# Patient Record
Sex: Female | Born: 1935 | Race: Black or African American | Hispanic: No | Smoking: Never smoker
Health system: Southern US, Community
[De-identification: ages and names within clinical notes are randomized; demographics above are authoritative.]

## PROBLEM LIST (undated history)

## (undated) DIAGNOSIS — N3946 Mixed incontinence: Secondary | ICD-10-CM

## (undated) DIAGNOSIS — F0390 Unspecified dementia without behavioral disturbance: Secondary | ICD-10-CM

## (undated) DIAGNOSIS — G309 Alzheimer's disease, unspecified: Secondary | ICD-10-CM

## (undated) DIAGNOSIS — F329 Major depressive disorder, single episode, unspecified: Secondary | ICD-10-CM

## (undated) DIAGNOSIS — F32A Depression, unspecified: Secondary | ICD-10-CM

## (undated) DIAGNOSIS — I10 Essential (primary) hypertension: Secondary | ICD-10-CM

## (undated) DIAGNOSIS — F0280 Dementia in other diseases classified elsewhere without behavioral disturbance: Secondary | ICD-10-CM

## (undated) DIAGNOSIS — D649 Anemia, unspecified: Secondary | ICD-10-CM

## (undated) DIAGNOSIS — M1990 Unspecified osteoarthritis, unspecified site: Secondary | ICD-10-CM

## (undated) DIAGNOSIS — E079 Disorder of thyroid, unspecified: Secondary | ICD-10-CM

## (undated) HISTORY — PX: NO PAST SURGERIES: SHX2092

---

## 2006-10-09 ENCOUNTER — Inpatient Hospital Stay: Payer: Self-pay | Admitting: Internal Medicine

## 2006-10-09 ENCOUNTER — Other Ambulatory Visit: Payer: Self-pay

## 2006-10-10 ENCOUNTER — Other Ambulatory Visit: Payer: Self-pay

## 2006-10-31 ENCOUNTER — Ambulatory Visit: Payer: Self-pay | Admitting: Internal Medicine

## 2008-04-03 IMAGING — CR DG CHEST 1V PORT
1 series · 1 of 1 positions shown · non-contrast
Comparison: none

REASON FOR EXAM: Low O2
COMMENTS:

[view not recorded]
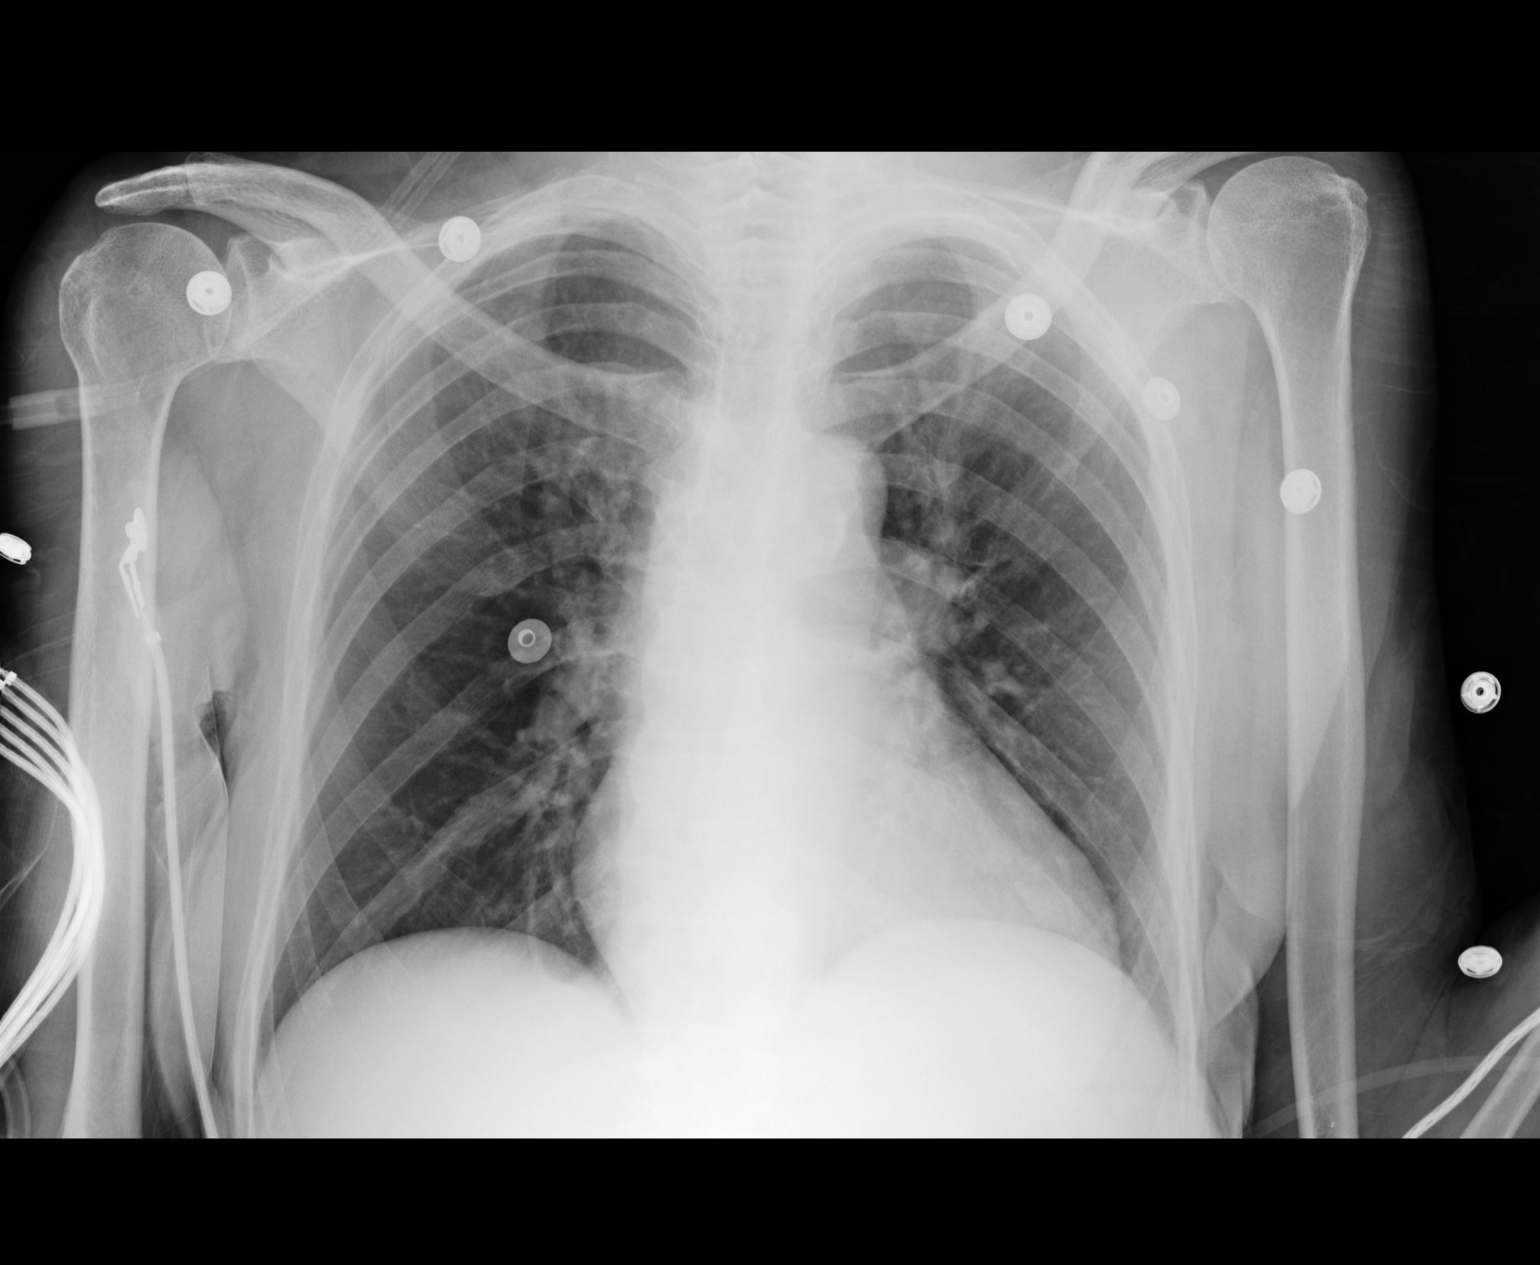

[1 of 1 positions shown; findings below may reference images not displayed]

PROCEDURE:     DXR - DXR PORTABLE CHEST SINGLE VIEW  - October 09, 2006 [DATE]

RESULT:     AP portable exam made on October 09, 2006 at [DATE] p.m. is
compared with the same day obtained at [DATE] p.m.  The cardiomediastinal
structures are within normal limits.  The lung fields are clear with no
effusions.
IMPRESSION: Lung fields are clear.

## 2013-06-13 ENCOUNTER — Ambulatory Visit: Payer: Self-pay | Admitting: Hematology and Oncology

## 2013-06-20 ENCOUNTER — Ambulatory Visit: Payer: Self-pay | Admitting: Surgery

## 2013-06-20 LAB — CBC CANCER CENTER
Basophil #: 0.1 x10 3/mm (ref 0.0–0.1)
Basophil %: 0.9 %
Eosinophil #: 0.2 x10 3/mm (ref 0.0–0.7)
HGB: 12.5 g/dL (ref 12.0–16.0)
Lymphocyte #: 2.6 x10 3/mm (ref 1.0–3.6)
Lymphocyte %: 37.1 %
MCHC: 35 g/dL (ref 32.0–36.0)
Monocyte %: 7.7 %
Neutrophil #: 3.7 x10 3/mm (ref 1.4–6.5)
Neutrophil %: 51.8 %
RBC: 3.99 10*6/uL (ref 3.80–5.20)
RDW: 13.2 % (ref 11.5–14.5)
WBC: 7.1 x10 3/mm (ref 3.6–11.0)

## 2013-06-20 LAB — COMPREHENSIVE METABOLIC PANEL
Anion Gap: 10 (ref 7–16)
BUN: 14 mg/dL (ref 7–18)
Bilirubin,Total: 0.7 mg/dL (ref 0.2–1.0)
Calcium, Total: 9.8 mg/dL (ref 8.5–10.1)
Chloride: 99 mmol/L (ref 98–107)
Creatinine: 1.08 mg/dL (ref 0.60–1.30)
EGFR (African American): 59 — ABNORMAL LOW
Osmolality: 272 (ref 275–301)
SGOT(AST): 20 U/L (ref 15–37)
Sodium: 135 mmol/L — ABNORMAL LOW (ref 136–145)

## 2013-07-14 ENCOUNTER — Ambulatory Visit: Payer: Self-pay | Admitting: Surgery

## 2014-04-09 ENCOUNTER — Ambulatory Visit: Payer: Self-pay | Admitting: Hematology and Oncology

## 2014-04-28 ENCOUNTER — Ambulatory Visit: Payer: Self-pay | Admitting: Hematology and Oncology

## 2014-10-07 ENCOUNTER — Inpatient Hospital Stay: Payer: Self-pay | Admitting: Internal Medicine

## 2014-10-07 LAB — COMPREHENSIVE METABOLIC PANEL
ALBUMIN: 3.4 g/dL (ref 3.4–5.0)
ALT: 36 U/L
AST: 24 U/L (ref 15–37)
Alkaline Phosphatase: 78 U/L
Anion Gap: 8 (ref 7–16)
BILIRUBIN TOTAL: 0.3 mg/dL (ref 0.2–1.0)
BUN: 12 mg/dL (ref 7–18)
CALCIUM: 8.8 mg/dL (ref 8.5–10.1)
CREATININE: 1.22 mg/dL (ref 0.60–1.30)
Chloride: 106 mmol/L (ref 98–107)
Co2: 27 mmol/L (ref 21–32)
EGFR (African American): 55 — ABNORMAL LOW
EGFR (Non-African Amer.): 46 — ABNORMAL LOW
Glucose: 164 mg/dL — ABNORMAL HIGH (ref 65–99)
Osmolality: 285 (ref 275–301)
POTASSIUM: 4.5 mmol/L (ref 3.5–5.1)
Sodium: 141 mmol/L (ref 136–145)
TOTAL PROTEIN: 8 g/dL (ref 6.4–8.2)

## 2014-10-07 LAB — URINALYSIS, COMPLETE
BILIRUBIN, UR: NEGATIVE
Bacteria: NONE SEEN
Blood: NEGATIVE
Glucose,UR: NEGATIVE mg/dL (ref 0–75)
KETONE: NEGATIVE
Nitrite: NEGATIVE
PH: 6 (ref 4.5–8.0)
PROTEIN: NEGATIVE
RBC,UR: 1 /HPF (ref 0–5)
SPECIFIC GRAVITY: 1.02 (ref 1.003–1.030)
WBC UR: 2 /HPF (ref 0–5)

## 2014-10-07 LAB — PROTIME-INR
INR: 1.1
Prothrombin Time: 14.3 secs (ref 11.5–14.7)

## 2014-10-07 LAB — CBC WITH DIFFERENTIAL/PLATELET
Basophil #: 0 10*3/uL (ref 0.0–0.1)
Basophil %: 0.3 %
EOS ABS: 0.1 10*3/uL (ref 0.0–0.7)
EOS PCT: 0.7 %
HCT: 34.6 % — AB (ref 35.0–47.0)
HGB: 11.7 g/dL — ABNORMAL LOW (ref 12.0–16.0)
LYMPHS PCT: 5.7 %
Lymphocyte #: 0.6 10*3/uL — ABNORMAL LOW (ref 1.0–3.6)
MCH: 31.6 pg (ref 26.0–34.0)
MCHC: 33.9 g/dL (ref 32.0–36.0)
MCV: 93 fL (ref 80–100)
Monocyte #: 0.5 x10 3/mm (ref 0.2–0.9)
Monocyte %: 5.1 %
NEUTROS ABS: 8.8 10*3/uL — AB (ref 1.4–6.5)
NEUTROS PCT: 88.2 %
Platelet: 170 10*3/uL (ref 150–440)
RBC: 3.71 10*6/uL — AB (ref 3.80–5.20)
RDW: 13 % (ref 11.5–14.5)
WBC: 10 10*3/uL (ref 3.6–11.0)

## 2014-10-07 LAB — PHOSPHORUS: PHOSPHORUS: 1.2 mg/dL — AB (ref 2.5–4.9)

## 2014-10-07 LAB — ED INFLUENZA
H1N1FLUPCR: NOT DETECTED
INFLBPCR: NEGATIVE
Influenza A By PCR: NEGATIVE

## 2014-10-07 LAB — MAGNESIUM: MAGNESIUM: 1.3 mg/dL — AB

## 2014-10-07 LAB — TROPONIN I: Troponin-I: <0.02 ng/mL

## 2014-10-08 LAB — BASIC METABOLIC PANEL
Anion Gap: 7 (ref 7–16)
BUN: 10 mg/dL (ref 7–18)
CO2: 25 mmol/L (ref 21–32)
CREATININE: 1.16 mg/dL (ref 0.60–1.30)
Calcium, Total: 7.8 mg/dL — ABNORMAL LOW (ref 8.5–10.1)
Chloride: 108 mmol/L — ABNORMAL HIGH (ref 98–107)
EGFR (African American): 59 — ABNORMAL LOW
EGFR (Non-African Amer.): 48 — ABNORMAL LOW
Glucose: 91 mg/dL (ref 65–99)
Osmolality: 278 (ref 275–301)
Potassium: 4 mmol/L (ref 3.5–5.1)
Sodium: 140 mmol/L (ref 136–145)

## 2014-10-08 LAB — CBC WITH DIFFERENTIAL/PLATELET
Basophil #: 0 10*3/uL (ref 0.0–0.1)
Basophil %: 0.1 %
Eosinophil #: 0.1 10*3/uL (ref 0.0–0.7)
Eosinophil %: 1.7 %
HCT: 30.2 % — ABNORMAL LOW (ref 35.0–47.0)
HGB: 10.1 g/dL — ABNORMAL LOW (ref 12.0–16.0)
LYMPHS ABS: 0.8 10*3/uL — AB (ref 1.0–3.6)
Lymphocyte %: 10 %
MCH: 31.1 pg (ref 26.0–34.0)
MCHC: 33.6 g/dL (ref 32.0–36.0)
MCV: 93 fL (ref 80–100)
MONOS PCT: 7.9 %
Monocyte #: 0.7 x10 3/mm (ref 0.2–0.9)
NEUTROS PCT: 80.3 %
Neutrophil #: 6.7 10*3/uL — ABNORMAL HIGH (ref 1.4–6.5)
Platelet: 138 10*3/uL — ABNORMAL LOW (ref 150–440)
RBC: 3.25 10*6/uL — ABNORMAL LOW (ref 3.80–5.20)
RDW: 12.8 % (ref 11.5–14.5)
WBC: 8.4 10*3/uL (ref 3.6–11.0)

## 2014-10-08 LAB — PHOSPHORUS: Phosphorus: 1.9 mg/dL — ABNORMAL LOW (ref 2.5–4.9)

## 2014-10-08 LAB — MAGNESIUM: Magnesium: 2.1 mg/dL

## 2014-10-09 LAB — CBC WITH DIFFERENTIAL/PLATELET
Basophil #: 0 10*3/uL (ref 0.0–0.1)
Basophil %: 0.4 %
EOS PCT: 5.8 %
Eosinophil #: 0.3 10*3/uL (ref 0.0–0.7)
HCT: 29.7 % — ABNORMAL LOW (ref 35.0–47.0)
HGB: 10 g/dL — ABNORMAL LOW (ref 12.0–16.0)
Lymphocyte #: 1.1 10*3/uL (ref 1.0–3.6)
Lymphocyte %: 20.3 %
MCH: 31.4 pg (ref 26.0–34.0)
MCHC: 33.6 g/dL (ref 32.0–36.0)
MCV: 93 fL (ref 80–100)
MONO ABS: 0.5 x10 3/mm (ref 0.2–0.9)
MONOS PCT: 10.2 %
NEUTROS ABS: 3.3 10*3/uL (ref 1.4–6.5)
Neutrophil %: 63.3 %
Platelet: 126 10*3/uL — ABNORMAL LOW (ref 150–440)
RBC: 3.18 10*6/uL — ABNORMAL LOW (ref 3.80–5.20)
RDW: 13.2 % (ref 11.5–14.5)
WBC: 5.2 10*3/uL (ref 3.6–11.0)

## 2014-10-09 LAB — BASIC METABOLIC PANEL
Anion Gap: 7 (ref 7–16)
BUN: 7 mg/dL (ref 7–18)
CHLORIDE: 107 mmol/L (ref 98–107)
CO2: 24 mmol/L (ref 21–32)
CREATININE: 1.05 mg/dL (ref 0.60–1.30)
Calcium, Total: 8.3 mg/dL — ABNORMAL LOW (ref 8.5–10.1)
GFR CALC NON AF AMER: 54 — AB
Glucose: 101 mg/dL — ABNORMAL HIGH (ref 65–99)
Osmolality: 274 (ref 275–301)
Potassium: 3.8 mmol/L (ref 3.5–5.1)
SODIUM: 138 mmol/L (ref 136–145)

## 2014-10-09 LAB — URINE CULTURE

## 2014-10-09 LAB — PHOSPHORUS: PHOSPHORUS: 2.1 mg/dL — AB (ref 2.5–4.9)

## 2014-10-10 LAB — BETA STREP CULTURE(ARMC)

## 2014-10-12 LAB — CULTURE, BLOOD (SINGLE)

## 2014-10-16 ENCOUNTER — Emergency Department: Payer: Self-pay | Admitting: Emergency Medicine

## 2014-10-16 LAB — URINALYSIS, COMPLETE
BACTERIA: NONE SEEN
Bilirubin,UR: NEGATIVE
GLUCOSE, UR: NEGATIVE mg/dL (ref 0–75)
Ketone: NEGATIVE
NITRITE: NEGATIVE
PH: 7 (ref 4.5–8.0)
Protein: 100
RBC,UR: 155 /HPF (ref 0–5)
Specific Gravity: 1.019 (ref 1.003–1.030)
Squamous Epithelial: 17

## 2014-10-16 LAB — CBC
HCT: 36.3 % (ref 35.0–47.0)
HGB: 12 g/dL (ref 12.0–16.0)
MCH: 30.6 pg (ref 26.0–34.0)
MCHC: 33.1 g/dL (ref 32.0–36.0)
MCV: 93 fL (ref 80–100)
Platelet: 114 10*3/uL — ABNORMAL LOW (ref 150–440)
RBC: 3.92 10*6/uL (ref 3.80–5.20)
RDW: 13.2 % (ref 11.5–14.5)
WBC: 4.1 10*3/uL (ref 3.6–11.0)

## 2014-10-16 LAB — COMPREHENSIVE METABOLIC PANEL
ALBUMIN: 3.4 g/dL (ref 3.4–5.0)
Alkaline Phosphatase: 58 U/L
Anion Gap: 12 (ref 7–16)
BILIRUBIN TOTAL: 0.4 mg/dL (ref 0.2–1.0)
BUN: 9 mg/dL (ref 7–18)
CHLORIDE: 98 mmol/L (ref 98–107)
CO2: 23 mmol/L (ref 21–32)
CREATININE: 1.27 mg/dL (ref 0.60–1.30)
Calcium, Total: 8.8 mg/dL (ref 8.5–10.1)
EGFR (Non-African Amer.): 44 — ABNORMAL LOW
GFR CALC AF AMER: 53 — AB
Glucose: 119 mg/dL — ABNORMAL HIGH (ref 65–99)
Osmolality: 266 (ref 275–301)
POTASSIUM: 4.2 mmol/L (ref 3.5–5.1)
SGOT(AST): 43 U/L — ABNORMAL HIGH (ref 15–37)
SGPT (ALT): 51 U/L
SODIUM: 133 mmol/L — AB (ref 136–145)
Total Protein: 8.2 g/dL (ref 6.4–8.2)

## 2014-10-17 LAB — URINE CULTURE

## 2015-02-28 ENCOUNTER — Emergency Department
Admit: 2015-02-28 | Disposition: A | Discharge status: Skilled Nursing Facility | Payer: Self-pay | Admitting: Emergency Medicine

## 2015-02-28 LAB — CBC
HCT: 36.4 % (ref 35.0–47.0)
HGB: 12.2 g/dL (ref 12.0–16.0)
MCH: 30.3 pg (ref 26.0–34.0)
MCHC: 33.5 g/dL (ref 32.0–36.0)
MCV: 91 fL (ref 80–100)
PLATELETS: 160 10*3/uL (ref 150–440)
RBC: 4.01 10*6/uL (ref 3.80–5.20)
RDW: 13.2 % (ref 11.5–14.5)
WBC: 10.9 10*3/uL (ref 3.6–11.0)

## 2015-02-28 LAB — COMPREHENSIVE METABOLIC PANEL
ALK PHOS: 65 U/L
Albumin: 4.2 g/dL
Anion Gap: 9 (ref 7–16)
BUN: 15 mg/dL
Bilirubin,Total: 0.6 mg/dL
CALCIUM: 10 mg/dL
Chloride: 103 mmol/L
Co2: 26 mmol/L
Creatinine: 0.9 mg/dL
EGFR (African American): >60
EGFR (Non-African Amer.): >60
Glucose: 145 mg/dL — ABNORMAL HIGH
POTASSIUM: 4 mmol/L
SGOT(AST): 26 U/L
SGPT (ALT): 24 U/L
Sodium: 138 mmol/L
TOTAL PROTEIN: 8.4 g/dL — AB

## 2015-02-28 LAB — URINALYSIS, COMPLETE
BLOOD: NEGATIVE
Bilirubin,UR: NEGATIVE
Glucose,UR: NEGATIVE mg/dL (ref 0–75)
Ketone: NEGATIVE
NITRITE: POSITIVE
Ph: 7 (ref 4.5–8.0)
Protein: NEGATIVE
Specific Gravity: 1.017 (ref 1.003–1.030)

## 2015-03-02 LAB — URINE CULTURE

## 2015-03-06 NOTE — H&P (Signed)
PATIENT NAME:  Cassandra Steele, Cassandra Steele MR#:  [id number] DATE OF BIRTH:  Apr 03, 1936  PRIMARY CARE PHYSICIAN: Lavera Guise, MD  CHIEF COMPLAINT: Fever from facility.   HISTORY OF PRESENT ILLNESS: This is a 79 year old female with a history of dementia, who presents with the above complaint. Apparently yesterday the patient was complaining of a sore throat, had a fever. They started Bactrim. Today, she continues to have fevers, so she was brought to the ER for further evaluation. In the ER, temperature was 103. Apparently, at the facility at [facility], there is some sort of viral illness going around and possibly flu, but there is no confirmed flu for this patient. Her flu here is negative.    REVIEW OF SYSTEMS: Unable to obtain as the patient has advanced dementia.   PAST MEDICAL HISTORY:  As per medical chart:  1.  Dementia.  2.  Hypertension.  3.  Depressive disorder.  4.  Hypothyroidism.  5.  Hypertension.  6.  Anemia of chronic disease.   MEDICATIONS: 1.  Aspirin 81 mg daily.  2.  Calcium 1 tablet p.o. b.i.d.  3.  Citalopram 40 mg daily. 4.  Haldol 0.5 mg in the morning.  5.  Haldol 0.5 mg p.r.n. agitation.  6.  Hydrocortisone topical b.i.d. p.r.n.  7.  Hydroxyzine hydrochloride 25 mg b.i.d. p.r.n.  8.  Synthroid 75 mcg daily.  9.  Ativan 0.5 mg q. 8 hours p.r.n.  10.  Metoprolol 25, 1/2 tablet b.i.d.  11.  Pravastatin 10 mg at bedtime.  12.  Proctozone, apply to affected area daily.  13.  Bactrim 1 tablet p.o. b.i.d., which was started yesterday for 7 days.  14.  Tylenol 325 two tablets q. 6 hours p.r.n. pain.   ALLERGIES: No known drug allergies.   PAST SURGICAL HISTORY: None recorded as per medical chart.   SOCIAL HISTORY:  Unknown tobacco, alcohol, or IV drug use as the patient has dementia and not documented in the medical chart.  FAMILY HISTORY: At this time due to her dementia this is unknown.   PHYSICAL EXAMINATION: VITAL SIGNS: Temperature 103.3, pulse 95,  respirations 18, blood pressure 96/59, 98% on room air.  GENERAL: The patient is demented, does not appear to be any acute distress. HEENT: Head is atraumatic. Pupils are round. Sclerae anicteric. Mucous membranes are moist. There are no exudates noted. Oropharynx is not erythematous. Tympanic membranes are intact with good light reflex.  NECK: Supple. No JVD, carotid bruit, or enlarged thyroid. No lymphadenopathy.  CARDIOVASCULAR:  Regular rate and rhythm. No murmur, gallops or rubs. PMI is not displaced.  LUNGS: Clear to auscultation without crackles, rales, rhonchi, or wheezing. Normal to percussion.  ABDOMEN:  Bowel sounds are positive.  Nontender, nondistended. No hepatosplenomegaly. EXTREMITIES: No clubbing, cyanosis, or edema. NEUROLOGIC:  Cranial nerves II through XII are grossly intact.  Unable to do a thorough neurologic exam as the patient is not very cooperative.  SKIN:  Reveals no rashes, lesions.  LABORATORY DATA:  Urinalysis trace LCE with just 2 white blood cells, but no bacteria. White blood cells 10.0, hemoglobin 11.7, hematocrit 35, platelets are 170,000. Sodium 141, potassium 4.5, chloride 106, bicarbonate 27, BUN 12, creatinine 1.22, glucose is 164, calcium 8.8, bilirubin is 0.3, alkaline phosphatase 78, AST 36, AST is 24, total protein 8.0, albumin is 3.4, magnesium 1.3. Troponin less than 0.02. Phosphorus is 1.2. INR is 1.1.  Influenza A and B are negative.  Lactic acid is 3.9.   IMAGING:  Chest  x-ray shows no acute cardiopulmonary disease.   EKG: Sinus tachycardia. No ST elevation or depression.   ASSESSMENT AND PLAN:  A 79 year old female with dementia, hypothyroidism, hypertension, who presents with a fever from her facility.  1.  Fever of unknown origin. The patient's chest x-ray shows no infiltrate. Her urinalysis is negative. Influenza A and B are negative. She has no bedsores or any skin conditions that would cause fever, so for this reason the patient will be admitted  with fever and sepsis. Sepsis criteria is met as she does have sepsis and hypotension.  I have empirically placed her on Zosyn. Blood cultures have been ordered along with a urine culture. We will repeat a chest x-ray in the a.m. I have also ordered a streptococcal throat culture as the patient was complaining apparently of sore throat yesterday and was placed on Bactrim for this, although on examination her oropharynx has no evidence of infection. Will monitor the patient's fever and further evaluation after the next 24 hours. 2.  Sepsis of unknown etiology with fever and hypotension.  Will monitor patient closely. The patient is on empiric antibiotics.  Blood and urine cultures have been ordered which will need to be followed up.  I will go ahead and order some fluids as well for her hypotension. 3.  Dementia with mood disorder. We will continue Haldol, citalopram.  4.  History of essential hypertension. Due to patient's low blood pressure, we will hold her blood pressure medications at this time.  5.  Hypothyroidism.  Will continue Synthroid.  .  The patient is full code status.   TIME SPENT: Approximately 45 minutes.   ____________________________ Donell Beers. Benjie Karvonen, MD spm:LT D: 10/07/2014 19:08:44 ET T: 10/07/2014 19:43:09 ET JOB#: [id number]  cc: Tarquin Welcher P. Benjie Karvonen, MD, <Dictator> Lavera Guise, MD Donell Beers Shaquasha Gerstel MD ELECTRONICALLY SIGNED 10/07/2014 20:44

## 2015-03-06 NOTE — Discharge Summary (Signed)
PATIENT NAME:  Cassandra Steele, Belia MR#:  [id number] DATE OF BIRTH:  30-May-1936  DATE OF ADMISSION:  10/07/2014 DATE OF DISCHARGE:  10/09/2014  PRIMARY CARE PHYSICIAN: Beverely RisenFozia Khan, MD  DISCHARGE DIAGNOSES: 1.  Fever of unknown origin. 2.  Sepsis of unknown etiology with fever and hypotension. 3.  Dementia with mood disorder. 4.  Anemia of chronic disease.   CONDITION: Stable.   CODE STATUS: FULL code.  HOME MEDICATIONS: Please refer to the medication reconciliation list. The patient's Lopressor was discontinued due to hypotension.   DISCHARGE DIET: Low-sodium, low-fat, low-cholesterol diet.   DISCHARGE ACTIVITY: As tolerated.   FOLLOW-UP CARE: Follow up with PCP within 1 to 2 weeks.   REASON FOR ADMISSION: Fever from facility.  HOSPITAL COURSE: The patient is a 79 year old African American female with a history of dementia who was sent from facility due to fever and some sore throat. The patient had a fever of 103 and was sent to ED. The patient is demented. No complaints. The patient had chest x-rays negative, urinalysis negative. WBC is in normal range. Renal function is in normal range. The patient's blood culture so far is negative. The patient has been treated with Zosyn; however, the patient's blood culture is negative so far.   For hypotension, which is possibly due to hypertension medication, Lopressor was discontinued. The patient's blood pressure is stable. We will hold Lopressor after discharge.   Anemia of chronic disease, is stable.   The patient's vital signs stable.   Physical examination is unremarkable. So far labs are stable. The patient will be discharged back to nursing facility today.   I discussed the patient's discharge plan with the nurse, social worker. The patient needs follow-up with her PCP within 1 to 2 weeks.   TIME SPENT: About 36 minutes.   ____________________________ Shaune PollackQing Sharine Cadle, MD qc:sb D: 10/09/2014 08:01:11 ET T: 10/09/2014 08:15:27  ET JOB#: [id number]  cc: Shaune PollackQing Marceline Napierala, MD, <Dictator> Shaune PollackQING Delan Ksiazek MD ELECTRONICALLY SIGNED 10/09/2014 15:23

## 2015-04-18 ENCOUNTER — Inpatient Hospital Stay
Admission: EM | Admit: 2015-04-18 | Discharge: 2015-04-21 | DRG: 871 | Disposition: A | Discharge status: Home Health | Payer: Medicare Other | Attending: Internal Medicine | Admitting: Internal Medicine

## 2015-04-18 ENCOUNTER — Emergency Department: Payer: Medicare Other

## 2015-04-18 ENCOUNTER — Encounter: Payer: Self-pay | Admitting: Internal Medicine

## 2015-04-18 DIAGNOSIS — R1110 Vomiting, unspecified: Secondary | ICD-10-CM | POA: Diagnosis present

## 2015-04-18 DIAGNOSIS — R109 Unspecified abdominal pain: Secondary | ICD-10-CM

## 2015-04-18 DIAGNOSIS — N390 Urinary tract infection, site not specified: Secondary | ICD-10-CM | POA: Diagnosis present

## 2015-04-18 DIAGNOSIS — F329 Major depressive disorder, single episode, unspecified: Secondary | ICD-10-CM | POA: Diagnosis present

## 2015-04-18 DIAGNOSIS — F0280 Dementia in other diseases classified elsewhere without behavioral disturbance: Secondary | ICD-10-CM | POA: Diagnosis present

## 2015-04-18 DIAGNOSIS — D638 Anemia in other chronic diseases classified elsewhere: Secondary | ICD-10-CM | POA: Diagnosis present

## 2015-04-18 DIAGNOSIS — G9340 Encephalopathy, unspecified: Secondary | ICD-10-CM | POA: Diagnosis present

## 2015-04-18 DIAGNOSIS — B955 Unspecified streptococcus as the cause of diseases classified elsewhere: Secondary | ICD-10-CM | POA: Diagnosis present

## 2015-04-18 DIAGNOSIS — A419 Sepsis, unspecified organism: Secondary | ICD-10-CM | POA: Diagnosis present

## 2015-04-18 DIAGNOSIS — I10 Essential (primary) hypertension: Secondary | ICD-10-CM | POA: Diagnosis present

## 2015-04-18 DIAGNOSIS — R6520 Severe sepsis without septic shock: Secondary | ICD-10-CM | POA: Diagnosis present

## 2015-04-18 DIAGNOSIS — F05 Delirium due to known physiological condition: Secondary | ICD-10-CM | POA: Diagnosis present

## 2015-04-18 HISTORY — DX: Essential (primary) hypertension: I10

## 2015-04-18 HISTORY — DX: Unspecified dementia, unspecified severity, without behavioral disturbance, psychotic disturbance, mood disturbance, and anxiety: F03.90

## 2015-04-18 LAB — URINALYSIS COMPLETE WITH MICROSCOPIC (ARMC ONLY)
BILIRUBIN URINE: NEGATIVE
GLUCOSE, UA: NEGATIVE mg/dL
KETONES UR: NEGATIVE mg/dL
Nitrite: NEGATIVE
PH: 5 (ref 5.0–8.0)
PROTEIN: 30 mg/dL — AB
Specific Gravity, Urine: 1.014 (ref 1.005–1.030)

## 2015-04-18 LAB — LACTIC ACID, PLASMA
LACTIC ACID, VENOUS: 3.2 mmol/L — AB (ref 0.5–2.0)
Lactic Acid, Venous: 7.9 mmol/L (ref 0.5–2.0)

## 2015-04-18 LAB — LIPASE, BLOOD: LIPASE: 28 U/L (ref 22–51)

## 2015-04-18 LAB — COMPREHENSIVE METABOLIC PANEL
ALT: 29 U/L (ref 14–54)
AST: 42 U/L — ABNORMAL HIGH (ref 15–41)
Albumin: 4.1 g/dL (ref 3.5–5.0)
Alkaline Phosphatase: 71 U/L (ref 38–126)
Anion gap: 17 — ABNORMAL HIGH (ref 5–15)
BUN: 15 mg/dL (ref 6–20)
CALCIUM: 9.6 mg/dL (ref 8.9–10.3)
CHLORIDE: 99 mmol/L — AB (ref 101–111)
CO2: 24 mmol/L (ref 22–32)
Creatinine, Ser: 1.04 mg/dL — ABNORMAL HIGH (ref 0.44–1.00)
GFR calc Af Amer: 59 mL/min — ABNORMAL LOW (ref >60)
GFR, EST NON AFRICAN AMERICAN: 51 mL/min — AB (ref >60)
GLUCOSE: 120 mg/dL — AB (ref 65–99)
POTASSIUM: 3.3 mmol/L — AB (ref 3.5–5.1)
Sodium: 140 mmol/L (ref 135–145)
Total Bilirubin: 1 mg/dL (ref 0.3–1.2)
Total Protein: 8.3 g/dL — ABNORMAL HIGH (ref 6.5–8.1)

## 2015-04-18 LAB — URINE DRUG SCREEN, QUALITATIVE (ARMC ONLY)
AMPHETAMINES, UR SCREEN: NOT DETECTED
BARBITURATES, UR SCREEN: NOT DETECTED
BENZODIAZEPINE, UR SCRN: NOT DETECTED
CANNABINOID 50 NG, UR ~~LOC~~: NOT DETECTED
COCAINE METABOLITE, UR ~~LOC~~: NOT DETECTED
MDMA (Ecstasy)Ur Screen: NOT DETECTED
METHADONE SCREEN, URINE: NOT DETECTED
Opiate, Ur Screen: NOT DETECTED
PHENCYCLIDINE (PCP) UR S: NOT DETECTED
TRICYCLIC, UR SCREEN: NOT DETECTED

## 2015-04-18 LAB — CBC WITH DIFFERENTIAL/PLATELET
Basophils Absolute: 0 10*3/uL (ref 0–0.1)
Basophils Relative: 0 %
Eosinophils Absolute: 0 10*3/uL (ref 0–0.7)
Eosinophils Relative: 0 %
HCT: 36.3 % (ref 35.0–47.0)
Hemoglobin: 11.9 g/dL — ABNORMAL LOW (ref 12.0–16.0)
LYMPHS PCT: 10 %
Lymphs Abs: 1.3 10*3/uL (ref 1.0–3.6)
MCH: 30.3 pg (ref 26.0–34.0)
MCHC: 32.8 g/dL (ref 32.0–36.0)
MCV: 92.4 fL (ref 80.0–100.0)
MONO ABS: 0.1 10*3/uL — AB (ref 0.2–0.9)
Monocytes Relative: 1 %
Neutro Abs: 12.5 10*3/uL — ABNORMAL HIGH (ref 1.4–6.5)
Neutrophils Relative %: 89 %
Platelets: 194 10*3/uL (ref 150–440)
RBC: 3.93 MIL/uL (ref 3.80–5.20)
RDW: 13.5 % (ref 11.5–14.5)
WBC: 14 10*3/uL — AB (ref 3.6–11.0)

## 2015-04-18 LAB — ETHANOL

## 2015-04-18 LAB — TROPONIN I

## 2015-04-18 MED ORDER — ACETAMINOPHEN 325 MG PO TABS
650.0000 mg | ORAL_TABLET | Freq: Four times a day (QID) | ORAL | Status: DC | PRN
  Administered 2015-04-18: 650 mg via ORAL
  Filled 2015-04-18: qty 2

## 2015-04-18 MED ORDER — BISACODYL 10 MG RE SUPP: 10.0000 mg | Freq: Every day | RECTAL | Status: DC | PRN

## 2015-04-18 MED ORDER — DOCUSATE SODIUM 100 MG PO CAPS
100.0000 mg | ORAL_CAPSULE | Freq: Two times a day (BID) | ORAL | Status: DC
  Administered 2015-04-18 – 2015-04-21 (×6): 100 mg via ORAL
  Filled 2015-04-18 (×6): qty 1

## 2015-04-18 MED ORDER — HALOPERIDOL 0.5 MG PO TABS
0.5000 mg | ORAL_TABLET | Freq: Four times a day (QID) | ORAL | Status: DC | PRN
Start: 2015-04-18 — End: 2015-04-21
  Filled 2015-04-18: qty 1

## 2015-04-18 MED ORDER — POTASSIUM CHLORIDE IN NACL 20-0.9 MEQ/L-% IV SOLN
INTRAVENOUS | Status: DC
  Administered 2015-04-18 – 2015-04-20 (×5): via INTRAVENOUS
  Filled 2015-04-18 (×11): qty 1000

## 2015-04-18 MED ORDER — HYDROXYZINE HCL 25 MG PO TABS: 25.0000 mg | ORAL_TABLET | Freq: Four times a day (QID) | ORAL | Status: DC | PRN

## 2015-04-18 MED ORDER — SODIUM CHLORIDE 0.9 % IV SOLN
1000.0000 mL | Freq: Once | INTRAVENOUS | Status: AC
  Administered 2015-04-18: 1000 mL via INTRAVENOUS

## 2015-04-18 MED ORDER — CALCIUM CARBONATE-VITAMIN D 500-200 MG-UNIT PO TABS
1.0000 | ORAL_TABLET | Freq: Two times a day (BID) | ORAL | Status: DC
  Administered 2015-04-18 – 2015-04-21 (×6): 1 via ORAL
  Filled 2015-04-18 (×8): qty 1

## 2015-04-18 MED ORDER — CITALOPRAM HYDROBROMIDE 20 MG PO TABS
40.0000 mg | ORAL_TABLET | Freq: Every day | ORAL | Status: DC
  Administered 2015-04-18 – 2015-04-20 (×3): 40 mg via ORAL
  Filled 2015-04-18 (×3): qty 2

## 2015-04-18 MED ORDER — VANCOMYCIN HCL IN DEXTROSE 750-5 MG/150ML-% IV SOLN
750.0000 mg | INTRAVENOUS | Status: DC
  Administered 2015-04-19: 750 mg via INTRAVENOUS
  Filled 2015-04-18 (×2): qty 150

## 2015-04-18 MED ORDER — ADULT MULTIVITAMIN W/MINERALS CH
1.0000 | ORAL_TABLET | Freq: Every day | ORAL | Status: DC
  Administered 2015-04-19 – 2015-04-21 (×3): 1 via ORAL
  Filled 2015-04-18 (×3): qty 1

## 2015-04-18 MED ORDER — PRAVASTATIN SODIUM 10 MG PO TABS
10.0000 mg | ORAL_TABLET | Freq: Every day | ORAL | Status: DC
Start: 2015-04-18 — End: 2015-04-21
  Administered 2015-04-18 – 2015-04-20 (×3): 10 mg via ORAL
  Filled 2015-04-18 (×3): qty 1

## 2015-04-18 MED ORDER — DARIFENACIN HYDROBROMIDE ER 7.5 MG PO TB24
7.5000 mg | ORAL_TABLET | Freq: Every day | ORAL | Status: DC
  Administered 2015-04-18 – 2015-04-21 (×4): 7.5 mg via ORAL
  Filled 2015-04-18 (×5): qty 1

## 2015-04-18 MED ORDER — SODIUM CHLORIDE 0.9 % IV SOLN: 1000.0000 mL | Freq: Once | INTRAVENOUS | Status: DC

## 2015-04-18 MED ORDER — ASPIRIN 81 MG PO CHEW
81.0000 mg | CHEWABLE_TABLET | Freq: Every day | ORAL | Status: DC
  Administered 2015-04-19 – 2015-04-21 (×3): 81 mg via ORAL
  Filled 2015-04-18 (×3): qty 1

## 2015-04-18 MED ORDER — VANCOMYCIN HCL IN DEXTROSE 1-5 GM/200ML-% IV SOLN
1000.0000 mg | Freq: Once | INTRAVENOUS | Status: AC
  Administered 2015-04-18: 1000 mg via INTRAVENOUS

## 2015-04-18 MED ORDER — ONDANSETRON HCL 4 MG/2ML IJ SOLN: 4.0000 mg | Freq: Four times a day (QID) | INTRAMUSCULAR | Status: DC | PRN

## 2015-04-18 MED ORDER — LEVOTHYROXINE SODIUM 75 MCG PO TABS
75.0000 ug | ORAL_TABLET | Freq: Every day | ORAL | Status: DC
  Administered 2015-04-19 – 2015-04-21 (×3): 75 ug via ORAL
  Filled 2015-04-18 (×3): qty 1

## 2015-04-18 MED ORDER — MORPHINE SULFATE 2 MG/ML IJ SOLN: 2.0000 mg | INTRAMUSCULAR | Status: DC | PRN

## 2015-04-18 MED ORDER — LORAZEPAM 2 MG/ML IJ SOLN: 0.5000 mg | INTRAMUSCULAR | Status: DC | PRN

## 2015-04-18 MED ORDER — ONDANSETRON HCL 4 MG PO TABS: 4.0000 mg | ORAL_TABLET | Freq: Four times a day (QID) | ORAL | Status: DC | PRN

## 2015-04-18 MED ORDER — HEPARIN SODIUM (PORCINE) 5000 UNIT/ML IJ SOLN
5000.0000 [IU] | Freq: Three times a day (TID) | INTRAMUSCULAR | Status: DC
  Administered 2015-04-18 – 2015-04-19 (×2): 5000 [IU] via SUBCUTANEOUS
  Filled 2015-04-18 (×2): qty 1

## 2015-04-18 MED ORDER — PIPERACILLIN-TAZOBACTAM 3.375 G IVPB
3.3750 g | Freq: Once | INTRAVENOUS | Status: AC
  Administered 2015-04-18: 3.375 g via INTRAVENOUS

## 2015-04-18 MED ORDER — SODIUM CHLORIDE 0.9 % IJ SOLN
3.0000 mL | Freq: Two times a day (BID) | INTRAMUSCULAR | Status: DC
  Administered 2015-04-18 – 2015-04-21 (×5): 3 mL via INTRAVENOUS

## 2015-04-18 MED ORDER — PIPERACILLIN-TAZOBACTAM 3.375 G IVPB
3.3750 g | Freq: Three times a day (TID) | INTRAVENOUS | Status: DC
  Administered 2015-04-18 – 2015-04-19 (×2): 3.375 g via INTRAVENOUS
  Filled 2015-04-18 (×5): qty 50

## 2015-04-18 MED ORDER — ACETAMINOPHEN 650 MG RE SUPP: 650.0000 mg | Freq: Four times a day (QID) | RECTAL | Status: DC | PRN

## 2015-04-18 MED ORDER — PIPERACILLIN-TAZOBACTAM 3.375 G IVPB
INTRAVENOUS | Status: AC
  Administered 2015-04-18: 3.375 g via INTRAVENOUS
  Filled 2015-04-18: qty 50

## 2015-04-18 MED ORDER — VANCOMYCIN HCL IN DEXTROSE 1-5 GM/200ML-% IV SOLN
INTRAVENOUS | Status: AC
  Administered 2015-04-18: 1000 mg via INTRAVENOUS
  Filled 2015-04-18: qty 200

## 2015-04-18 NOTE — Progress Notes (Addendum)
ANTIBIOTIC CONSULT NOTE - INITIAL  Pharmacy Consult for Vancomycin  Indication: sepsis/UTI  No Known Allergies  Patient Measurements: Height: 5\' 5"  (165.1 cm) Weight: 170 lb (77.111 kg) IBW/kg (Calculated) : 57 Adjusted Body Weight: 65.04 kg  Vital Signs: Temp: 102.7 F (39.3 C) (06/05 1418) Temp Source: Rectal (06/05 1418) BP: 121/75 mmHg (06/05 1500) Pulse Rate: 119 (06/05 1430) Intake/Output from previous day:   Intake/Output from this shift:    Labs:  Recent Labs  04/18/15 1408  WBC 14.0*  HGB 11.9*  PLT 194  CREATININE 1.04*   Estimated Creatinine Clearance: 47.2 mL/min (by C-G formula based on Cr of 1.04). No results for input(s): VANCOTROUGH, VANCOPEAK, VANCORANDOM, GENTTROUGH, GENTPEAK, GENTRANDOM, TOBRATROUGH, TOBRAPEAK, TOBRARND, AMIKACINPEAK, AMIKACINTROU, AMIKACIN in the last 72 hours.   Microbiology: No results found for this or any previous visit (from the past 720 hour(s)).  Medical History: Past Medical History  Diagnosis Date  . Hypertension   . Dementia      Assessment: 79 yo female here with UTI/sepsis ordered vancomycin per pharmacy to dose. Also ordered Zosyn. Vancomycin 1000 mg IV x1 given in the ED at 1508.  UCx, Bcx x2 pending  Ke 0.039, half life 17.77, Vd 46 L  Goal of Therapy:  Vancomycin trough level 15-20 mcg/ml  Plan:  Vancomycin 750 mg IV q18h to start 6/6 (tomorrow AM) at 0500 for stacked dosing Vancomycin trough ordered for 6/8 at 1030 Will need to continue to monitor renal function and cultures    Crist FatHannah Ronnetta Currington, PharmD, BCPS Clinical Pharmacist 04/18/2015,3:41 PM

## 2015-04-18 NOTE — Progress Notes (Signed)
Patient admitted to unit. Alert but disoriented x4. Unable to complete full admission navigator at this time due to patient's confusion and no family/caregivers present. Full assessment to Epic. Will continue to monitor.  Cassandra Steele, Cassandra Steele

## 2015-04-18 NOTE — ED Notes (Signed)
Unable to obtain to EKG Dr Cyril LoosenKinner notified

## 2015-04-18 NOTE — H&P (Signed)
History and Physical    Cassandra Steele ZOX:[id number] DOB: 06/02/1936 DOA: 6/5/2Ferrel Logan016  Referring physician: Dr. Cyril LoosenKinner PCP: Beverely RisenFozia Khan Specialists: none  Chief Complaint: AMS  HPI: Cassandra Steele is a 79 y.o. female has a past medical history significant for Dementia and HTN sent to ER from family care home with lethargy, confusion, and vomiting. Febrile in ER with tachycardia. UTI noted. Pt confused and unable to give hx. No family of family care home staff present. Now admitted for sepsis from urinary source.  Review of Systems: unable to obtain due to patients delirium and dementia  Past Medical History  Diagnosis Date  . Hypertension   . Dementia    History reviewed. No pertinent past surgical history. Social History:  reports that she has never smoked. She does not have any smokeless tobacco history on file. She reports that she does not drink alcohol. Her drug history is not on file.  No Known Allergies  Family History  Problem Relation Age of Onset  . Family history unknown: Yes    Prior to Admission medications   Medication Sig Start Date End Date Taking? Authorizing Provider  citalopram (CELEXA) 40 MG tablet Take 40 mg by mouth at bedtime. 04/16/15  Yes Historical Provider, MD  diclofenac sodium (VOLTAREN) 1 % GEL Apply 1 application topically 2 (two) times daily as needed. 04/15/15  Yes Historical Provider, MD  haloperidol (HALDOL) 0.5 MG tablet Take 0.5 mg by mouth daily. 04/15/15  Yes Historical Provider, MD  hydrocortisone 2.5 % cream Apply 1 application topically 2 (two) times daily as needed. For rash. 04/15/15  Yes Historical Provider, MD  levothyroxine (SYNTHROID, LEVOTHROID) 75 MCG tablet Take 75 mcg by mouth daily. 04/02/15  Yes Historical Provider, MD  LORazepam (ATIVAN) 0.5 MG tablet Take 0.5 mg by mouth every 8 (eight) hours as needed. 03/15/15  Yes Historical Provider, MD  pravastatin (PRAVACHOL) 10 MG tablet Take 10 mg by mouth at bedtime. 04/15/15  Yes Historical  Provider, MD  VESICARE 5 MG tablet Take 5 mg by mouth every evening. 04/15/15  Yes Historical Provider, MD   Physical Exam: Filed Vitals:   04/18/15 1418 04/18/15 1430 04/18/15 1500  BP: 138/122 92/74 121/75  Pulse:  119   Temp: 102.7 F (39.3 C)    TempSrc: Rectal    Resp:  29 23  Height: 5\' 5"  (1.651 m)    Weight: 77.111 kg (170 lb)    SpO2: 94% 95%      General:  Confused, crying but in no acute distress  Eyes: PERRL, EOMI, no scleral icterus  ENT: moist oropharynx  Neck: supple, no lymphadenopathy  Cardiovascular: rapid rate with regular rhythm without MRG; 2+ peripheral pulses, no JVD, no peripheral edema  Respiratory: CTA biL, good air movement without wheezing, rhonchi or crackled  Abdomen: soft, tender to palpation, positive bowel sounds, guarding but no rebound  Skin: no rashes  Musculoskeletal: normal bulk and tone, no joint swelling  Psychiatric: confused, not answering questions  Neurologic: CN 2-12 grossly intact, MS 5/5 in all 4  Labs on Admission:  Basic Metabolic Panel:  Recent Labs Lab 04/18/15 1408  NA 140  K 3.3*  CL 99*  CO2 24  GLUCOSE 120*  BUN 15  CREATININE 1.04*  CALCIUM 9.6   Liver Function Tests:  Recent Labs Lab 04/18/15 1408  AST 42*  ALT 29  ALKPHOS 71  BILITOT 1.0  PROT 8.3*  ALBUMIN 4.1    Recent Labs Lab 04/18/15 1408  LIPASE  28   No results for input(s): AMMONIA in the last 168 hours. CBC:  Recent Labs Lab 04/18/15 1408  WBC 14.0*  NEUTROABS 12.5*  HGB 11.9*  HCT 36.3  MCV 92.4  PLT 194   Cardiac Enzymes:  Recent Labs Lab 04/18/15 1408  TROPONINI <0.03    BNP (last 3 results) No results for input(s): BNP in the last 8760 hours.  ProBNP (last 3 results) No results for input(s): PROBNP in the last 8760 hours.  CBG: No results for input(s): GLUCAP in the last 168 hours.  Radiological Exams on Admission: Dg Chest Portable 1 View  04/18/2015   CLINICAL DATA:  Vomiting and mental status  changes.  EXAM: PORTABLE CHEST - 1 VIEW  COMPARISON:  One-view chest 02/28/2015  FINDINGS: The heart size is normal. Lung volumes are low, exaggerating the interstitium. There is no edema or effusion to suggest failure. No focal airspace consolidation is present. Degenerative changes are again noted in the shoulders.  IMPRESSION: 1. Low lung volumes. 2. No acute cardiopulmonary disease.   Electronically Signed   By: Marin Roberts M.D.   On: 04/18/2015 15:04    EKG: Independently reviewed.  Assessment/Plan Principal Problem:   Sepsis Active Problems:   Acute UTI   Delirium superimposed on dementia   HTN, goal below 140/90   Will admit to floor with IV fluids(with K+) and IV ABX. Cultures sent. Consult PT and CSW. Repeat labs in AM.  Diet: soft Fluids: NS with K+ DVT Prophylaxis: SQ Heparin  Code Status: FULL  Family Communication: none  Disposition Plan: SNF  Time spent: 50 min

## 2015-04-18 NOTE — ED Provider Notes (Signed)
[facility] Emergency Department Provider Note  ____________________________________________  Time seen: On arrival  I have reviewed the triage vital signs and the nursing notes.   HISTORY  Chief Complaint Altered Mental Status   Limitations to history of altered mental status and chronic dementia (Level 5 caveat)   HPI Cassandra Steele is a 79 y.o. female who presents with altered mental status. At baseline patient has chronic dementia but apparently is acting for more confused and abnormal today. History is extremely limited because the patient is unable to provide me with any information. Patient is actively vomiting in the room.   PMH  Dementia Hypertension Anemia of chronic disease Depression  PSH  None   No current outpatient prescriptions on file.  Allergies Review of patient's allergies indicates no known allergies.  No family history on file.  Social History Does not smoke  does not drink  does not do drugs  Review of Systems Level 5 Caveat: Unable to obtain review of symptoms secondary to significant altered mental status.  ____________________________________________   PHYSICAL EXAM:  VITAL SIGNS:  BP 121/75 mmHg  Pulse 119  Temp(Src) 102.7 F (39.3 C) (Rectal)  Resp 23  Ht  (1.651 m)  Wt 170 lb (77.111 kg)  BMI 28.29 kg/m2  SpO2 95%  ED Triage Vitals  Enc Vitals Group     BP --      Pulse --      Resp --      Temp --      Temp src --      SpO2 --      Weight --      Height --      Head Cir --      Peak Flow --      Pain Score --      Pain Loc --      Pain Edu? --      Excl. in GC? --      Constitutional: Disoriented and confused, anxious and shaking Eyes: Conjunctivae are normal. PERRL. ENT   Head: Normocephalic and atraumatic.   Nose: No rhinnorhea.   Mouth/Throat: Mucous membranes are moist. Cardiovascular: Tachycardia. Normal and symmetric distal pulses are present in all  extremities. No murmurs, rubs, or gallops. Respiratory: Normal respiratory effort without tachypnea nor retractions. Breath sounds are clear and equal bilaterally.  Gastrointestinal: Soft and non-tender in all quadrants. No distention. There is no CVA tenderness. Genitourinary: deferred Musculoskeletal: Nontender with normal range of motion in all extremities. No lower extremity tenderness nor edema. Neurologic:  No gross focal neurologic deficits are appreciated. Skin:  Skin is warm, dry and intact. No rash noted. Psychiatric: Patient is anxious.  ____________________________________________    LABS (pertinent positives/negatives)  Labs Reviewed  COMPREHENSIVE METABOLIC PANEL - Abnormal; Notable for the following:    Potassium 3.3 (*)    Chloride 99 (*)    Glucose, Bld 120 (*)    Creatinine, Ser 1.04 (*)    Total Protein 8.3 (*)    AST 42 (*)    GFR calc non Af Amer 51 (*)    GFR calc Af Amer 59 (*)    Anion gap 17 (*)    All other components within normal limits  CBC WITH DIFFERENTIAL/PLATELET - Abnormal; Notable for the following:    WBC 14.0 (*)    Hemoglobin 11.9 (*)    Neutro Abs 12.5 (*)    Monocytes Absolute 0.1 (*)    All other components within  normal limits  URINALYSIS COMPLETEWITH MICROSCOPIC ([facility] ONLY) - Abnormal; Notable for the following:    Color, Urine AMBER (*)    APPearance TURBID (*)    Hgb urine dipstick 1+ (*)    Protein, ur 30 (*)    Leukocytes, UA 3+ (*)    Bacteria, UA RARE (*)    Squamous Epithelial / LPF 0-5 (*)    All other components within normal limits  LACTIC ACID, PLASMA - Abnormal; Notable for the following:    Lactic Acid, Venous 7.9 (*)    All other components within normal limits  URINE CULTURE  CULTURE, BLOOD (ROUTINE X 2)  CULTURE, BLOOD (ROUTINE X 2)  ETHANOL  TROPONIN I  LIPASE, BLOOD  URINE DRUG SCREEN, QUALITATIVE ([facility] ONLY)  LACTIC ACID, PLASMA    ____________________________________________   EKG  Unable to  obtain accurate EKG given patient shaking  ____________________________________________    RADIOLOGY  chest x-ray no acute distress  ____________________________________________   PROCEDURES  Procedure(s) performed: none  Critical Care performed: none  ____________________________________________   INITIAL IMPRESSION / ASSESSMENT AND PLAN / ED COURSE  Pertinent labs & imaging results that were available during my care of the patient were reviewed by me and considered in my medical decision making (see chart for details).  Patient with tachycardia and increased altered mental status presents with fever. Strongly suspect a urinary tract infection/possible sepsis.    ----------------------------------------- 3:13 PM on 04/18/2015 -----------------------------------------  Patient with lactic acid of 8.1, rectal temperature of 102.9, elevated white blood cell count and tachycardia, and altered mental status most consistent with sepsis. Vancomycin and Zosyn given empirically. 30 MLS per kilogram of normal saline. Patient admitted to the medicine service  ____________________________________________   FINAL CLINICAL IMPRESSION(S) / ED DIAGNOSES  Final diagnoses:  Severe sepsis     Jene Everyobert Colon Rueth, MD 04/18/15 [id number]

## 2015-04-18 NOTE — ED Notes (Signed)
Pt is from a family care home, pt has dementia, staff report pt started vomiting with a change in increased mental status, pt has a strong smell of urine, pt combative upon arrival

## 2015-04-18 NOTE — ED Notes (Signed)
Pt  combative ED tech at bedside

## 2015-04-19 LAB — CBC
HCT: 29.1 % — ABNORMAL LOW (ref 35.0–47.0)
Hemoglobin: 10.1 g/dL — ABNORMAL LOW (ref 12.0–16.0)
MCH: 31.1 pg (ref 26.0–34.0)
MCHC: 34.7 g/dL (ref 32.0–36.0)
MCV: 89.5 fL (ref 80.0–100.0)
Platelets: 141 10*3/uL — ABNORMAL LOW (ref 150–440)
RBC: 3.25 MIL/uL — AB (ref 3.80–5.20)
RDW: 13.4 % (ref 11.5–14.5)
WBC: 12.5 10*3/uL — AB (ref 3.6–11.0)

## 2015-04-19 LAB — COMPREHENSIVE METABOLIC PANEL
ALT: 23 U/L (ref 14–54)
AST: 34 U/L (ref 15–41)
Albumin: 3 g/dL — ABNORMAL LOW (ref 3.5–5.0)
Alkaline Phosphatase: 41 U/L (ref 38–126)
Anion gap: 5 (ref 5–15)
BUN: 16 mg/dL (ref 6–20)
CALCIUM: 8.6 mg/dL — AB (ref 8.9–10.3)
CO2: 26 mmol/L (ref 22–32)
CREATININE: 1.02 mg/dL — AB (ref 0.44–1.00)
Chloride: 108 mmol/L (ref 101–111)
GFR calc non Af Amer: 52 mL/min — ABNORMAL LOW (ref >60)
Glucose, Bld: 90 mg/dL (ref 65–99)
POTASSIUM: 3.9 mmol/L (ref 3.5–5.1)
Sodium: 139 mmol/L (ref 135–145)
Total Bilirubin: 1 mg/dL (ref 0.3–1.2)
Total Protein: 6.1 g/dL — ABNORMAL LOW (ref 6.5–8.1)

## 2015-04-19 LAB — GLUCOSE, CAPILLARY
GLUCOSE-CAPILLARY: 72 mg/dL (ref 65–99)
GLUCOSE-CAPILLARY: 148 mg/dL — AB (ref 65–99)

## 2015-04-19 LAB — URINE CULTURE: Culture: >=100000

## 2015-04-19 LAB — MRSA PCR SCREENING: MRSA by PCR: NEGATIVE

## 2015-04-19 MED ORDER — OXYCODONE HCL 5 MG PO TABS
5.0000 mg | ORAL_TABLET | Freq: Four times a day (QID) | ORAL | Status: DC | PRN
  Administered 2015-04-19: 16:00:00 5 mg via ORAL
  Filled 2015-04-19: qty 1

## 2015-04-19 MED ORDER — ENOXAPARIN SODIUM 40 MG/0.4ML ~~LOC~~ SOLN
40.0000 mg | SUBCUTANEOUS | Status: DC
  Administered 2015-04-19 – 2015-04-21 (×3): 40 mg via SUBCUTANEOUS
  Filled 2015-04-19 (×3): qty 0.4

## 2015-04-19 MED ORDER — CEFTRIAXONE SODIUM IN DEXTROSE 20 MG/ML IV SOLN
1.0000 g | INTRAVENOUS | Status: DC
  Administered 2015-04-19 – 2015-04-21 (×3): 1 g via INTRAVENOUS
  Filled 2015-04-19 (×4): qty 50

## 2015-04-19 MED ORDER — SODIUM CHLORIDE 0.9 % IV BOLUS (SEPSIS)
500.0000 mL | Freq: Once | INTRAVENOUS | Status: AC
  Administered 2015-04-19: 500 mL via INTRAVENOUS

## 2015-04-19 NOTE — Progress Notes (Signed)
Pt  Transferred to 118 via bed from rm 252. Alert but confused. Unable to answer questions appropriately. Crying spelles at times. ivfs infusing.  Pt did respond and say 'no I ain't hurting"Skin - no sores or suspicious lesions or rashes or color changes wm and dry. eccy  Rt chin. Feet elevated off bed/ intact.

## 2015-04-19 NOTE — Progress Notes (Signed)
Dr. Elpidio AnisSudini notified of patient continuing to pull off telemetry leads - MD to assess need for telemetry monitoring. Order to cancel BID urine culture orders. Adella NissenBailey, Kendi Defalco G

## 2015-04-19 NOTE — Progress Notes (Signed)
[facility] Physicians - [place] at [facility]   PATIENT NAME: Cassandra Steele    MR#:  [id number]  DATE OF BIRTH:  1936-10-01  SUBJECTIVE:  CHIEF COMPLAINT:   Chief Complaint  Patient presents with  . Altered Mental Status   Confused. Pulling on things.  REVIEW OF SYSTEMS:    ROS  Encephalopathy and dementia. Attempted but cannot obtain.   DRUG ALLERGIES:  No Known Allergies  VITALS:  Blood pressure 104/65, pulse 91, temperature 97.8 F (36.6 C), temperature source Oral, resp. rate 20, height  (1.651 m), weight 73.029 kg (161 lb), SpO2 96 %.  PHYSICAL EXAMINATION:   Physical Exam  GENERAL:  79 y.o.-year-old patient lying in the bed with no acute distress.  EYES: Pupils equal, round, reactive to light and accommodation. No scleral icterus. Extraocular muscles intact.  HEENT: Head atraumatic, normocephalic. Oropharynx and nasopharynx clear.  NECK:  Supple, no jugular venous distention. No thyroid enlargement, no tenderness.  LUNGS: Normal breath sounds bilaterally, no wheezing, rales, rhonchi. No use of accessory muscles of respiration.  CARDIOVASCULAR: S1, S2 normal. No murmurs, rubs, or gallops.  ABDOMEN: Soft, nontender, nondistended. Bowel sounds present. No organomegaly or mass.  EXTREMITIES: No cyanosis, clubbing or edema b/l.    NEUROLOGIC: Cranial nerves II through XII are intact. No focal Motor or sensory deficits b/l.   PSYCHIATRIC: The patient is alert and oriented x 3.  SKIN: No obvious rash, lesion, or ulcer.    LABORATORY PANEL:   CBC  Recent Labs Lab 04/19/15 0449  WBC 12.5*  HGB 10.1*  HCT 29.1*  PLT 141*   ------------------------------------------------------------------------------------------------------------------  Chemistries   Recent Labs Lab 04/19/15 0449  NA 139  K 3.9  CL 108  CO2 26  GLUCOSE 90  BUN 16  CREATININE 1.02*  CALCIUM 8.6*  AST 34  ALT 23  ALKPHOS 41  BILITOT 1.0    ------------------------------------------------------------------------------------------------------------------  Cardiac Enzymes  Recent Labs Lab 04/18/15 1408  TROPONINI <0.03   ------------------------------------------------------------------------------------------------------------------  RADIOLOGY:  Dg Abd 1 View  04/18/2015   CLINICAL DATA:  Foul-smelling urine, abdominal pain and combativeness today.  EXAM: ABDOMEN - 1 VIEW  COMPARISON:  02/28/2015  FINDINGS: The lung bases are grossly clear. Stable mild eventration of the left hemidiaphragm. The bowel gas pattern is unremarkable and stable. Large calcified uterine fibroids are again demonstrated.  IMPRESSION: No plain film findings for an acute abdominal process.   Electronically Signed   By: Rudie Meyer M.D.   On: 04/18/2015 16:22   Dg Chest Portable 1 View  04/18/2015   CLINICAL DATA:  Vomiting and mental status changes.  EXAM: PORTABLE CHEST - 1 VIEW  COMPARISON:  One-view chest 02/28/2015  FINDINGS: The heart size is normal. Lung volumes are low, exaggerating the interstitium. There is no edema or effusion to suggest failure. No focal airspace consolidation is present. Degenerative changes are again noted in the shoulders.  IMPRESSION: 1. Low lung volumes. 2. No acute cardiopulmonary disease.   Electronically Signed   By: Marin Roberts M.D.   On: 04/18/2015 15:04     ASSESSMENT AND PLAN:   * UTI with severe sepsis - POA Continue IV antibiotics. Bolus normal saline now. Monitor urine output. Await culture results.  * Acute encephalopathy over dementia Due to UTI, sepsis.  * HTN Hold home meds due to hypotension     All the records are reviewed and case discussed with Care Management/Social Workerr. Management plans discussed with the patient, family and they  are in agreement.  CODE STATUS: FULL CODE  DVT Prophylaxis: Lovenox  TOTAL TIME TAKING CARE OF THIS PATIENT: 35 minutes.   POSSIBLE D/C IN 2  AYS, DEPENDING ON CLINICAL CONDITION.   Milagros LollSudini, Yarelly Kuba R M.D on 04/19/2015 at 12:13 PM  Between 7am to 6pm - Pager - [phone]  After 6pm go to [email or web address] - password [email or web address] [facility]  [facility]  Office  [phone]  CC: Primary care physician; No PCP Per Patient

## 2015-04-19 NOTE — Progress Notes (Signed)
Per Dr. Elpidio AnisSudini, discontinue telemetry monitoring  Orders.  Cassandra NissenBailey, Cassandra Steele

## 2015-04-19 NOTE — Care Management (Signed)
Patient admitted for possible sepsis.  Temp on presentation was 102.  She has dementia.  At present time, she is requiring mittens due to combativeness.  Unsure of her baseline mental status.  CSW consult present

## 2015-04-19 NOTE — Progress Notes (Signed)
PT Cancellation Note  Patient Details Name: Cassandra Steele MRN: [id number] DOB: 08/02/1936   Cancelled Treatment:    Reason Eval/Treat Not Completed: Other (comment) (Per primary RN, patient combative, fighting staff at this time; unable to participate with PT session at this time.  Will hold and re-attempt at later time/date as patient appropriate.)   Divit Stipp H. Manson PasseyBrown, PT, DPT 04/19/2015, 8:36 AM [phone]

## 2015-04-19 NOTE — Progress Notes (Signed)
Patient transferring to room 118. Report called to receiving RN.  Cassandra Steele, Cassandra Steele

## 2015-04-20 NOTE — Plan of Care (Signed)
Problem: Discharge Progression Outcomes Goal: Other Discharge Outcomes/Goals Outcome: Progressing Plan of care progress to goal Pain: Pt did not c/o pain or discomfort. Hemodynamically stable: VSS this shift. IVF infusing at 5650mls/hr.  Activity appropriate: Pt high fall risk.Confused. Combative at times. Pt has been in the bed this shift.

## 2015-04-20 NOTE — Plan of Care (Signed)
Problem: Discharge Progression Outcomes Goal: Other Discharge Outcomes/Goals Outcome: Progressing Plan of care progress to goal:  No complaints of pain. IV fluids discontinued. Patient remains confused. High fall risk precautions - bed alarm on, room near nurses station. IV antibiotics infused as ordered. Incontinent of urine and stool.

## 2015-04-20 NOTE — Evaluation (Signed)
Physical Therapy Evaluation Patient Details Name: Cassandra Steele MRN: [id number] DOB: [birth year] Today's Date: 04/20/2015   History of Present Illness  presented to ER from family care home secondary to lethargy, vomiting and confusion; admitted for sepsis related to UTI.  Hospital course complicated by periods of combativeness.  Clinical Impression  Upon evaluation, patient oriented to self only. Unable to consistently follow simple commands, unable to respond appropriately to questions (speech illogical and off topic) or provide information regarding PLOF.  Difficult to fully assess strength and ROM due to cognitive deficits; patient often resisting therapist assist at times.  Currently requiring extensive physical assist (+2) for all functional mobility (rolling, sit/supine and sit/stand attempts).  Unable to complete full sit/stand or OOB activities this date due to patient resistance to movement and extensive amount of physical assist required.  Currently requiring total care (+2) for all mobility; would benefit from use of mechanical lift for all OOB attempts outside of therapy. Would benefit from TRIAL of skilled PT to address above deficits and promote optimal return to PLOF/decrease caregiver burden; recommend transition to STR upon discharge from acute hospitalization pending improvement in mental status and ability to actively participate/progress with therapy.      Follow Up Recommendations SNF (pending ability to actively participate/progress during TRIAL)    Equipment Recommendations       Recommendations for Other Services       Precautions / Restrictions Precautions Precautions: Fall Restrictions Weight Bearing Restrictions: No      Mobility  Bed Mobility Overal bed mobility: Needs Assistance;+2 for physical assistance Bed Mobility: Rolling;Supine to Sit;Sit to Supine Rolling: Total assist;+2 for physical assistance   Supine to sit: Max assist Sit to supine: Total  assist;+2 for physical assistance   General bed mobility comments: patient physically resisting at times  Transfers Overall transfer level: Needs assistance Equipment used: 2 person hand held assist Transfers: Sit to/from Stand Sit to Stand: +2 physical assistance;Total assist         General transfer comment: very heavy posterior pushing, resisting movement attempts by therapist.  Unable to complete full stand, barely able to lift buttocks from seating surface  Ambulation/Gait             General Gait Details: unsafe/unable at this time  Stairs            Wheelchair Mobility    Modified Rankin (Stroke Patients Only)       Balance Overall balance assessment: Needs assistance;No apparent balance deficits (not formally assessed) Sitting-balance support: No upper extremity supported;Feet supported Sitting balance-Leahy Scale: Fair       Standing balance-Leahy Scale: Zero                               Pertinent Vitals/Pain Pain Assessment: Faces Faces Pain Scale: Hurts a little bit Pain Location: intermittent crying with turning in bed, unable to quantify, describe or localize Pain Intervention(s): Repositioned    Home Living Family/patient expects to be discharged to::  ([facility])                      Prior Function Level of Independence: Needs assistance         Comments: Per facility representative, patient was able to ambulate around facility with assist from staff "when she's feeling well"; regularly requires assist for all ADLs.  Requires extensive assist "when she's sick" for all self-care and mobility.  Hand Dominance        Extremity/Trunk Assessment   Upper Extremity Assessment: Generalized weakness (difficult to fully assess due to cognitive deficist--at least 3-/5, though very resistant to therapist movement/assessment at times)           Lower Extremity Assessment:  Generalized weakness (difficult to fully assess due to cognitive deficist--at least 3-/5, though very resistant to therapist movement/assessment at times)         Communication      Cognition Arousal/Alertness: Awake/alert Behavior During Therapy: Anxious Overall Cognitive Status: No family/caregiver present to determine baseline cognitive functioning (patient oriented to self only.  Unable to follow simple commands or provide appropriate answer to questions.  Very illogical, non-sensical speech.  Unable to reason with/rationalize; resistant to movement at times)                      General Comments      Exercises        Assessment/Plan    PT Assessment Patient needs continued PT services  PT Diagnosis Difficulty walking;Generalized weakness;Altered mental status   PT Problem List Decreased strength;Decreased range of motion;Decreased balance;Decreased mobility;Decreased activity tolerance;Decreased cognition;Decreased knowledge of use of DME;Decreased safety awareness;Decreased knowledge of precautions  PT Treatment Interventions DME instruction;Gait training;Stair training;Functional mobility training;Therapeutic activities;Therapeutic exercise;Balance training;Cognitive remediation;Patient/family education   PT Goals (Current goals can be found in the Care Plan section) Acute Rehab PT Goals Patient Stated Goal: unable to verbalize PT Goal Formulation: With patient Time For Goal Achievement: 05/04/15 Potential to Achieve Goals: Fair Additional Goals Additional Goal #1: Assess and establish goals for gait as appropriate    Frequency Min 2X/week (TRIAL pending improvement in mental status and ability to participate/progress)   Barriers to discharge Decreased caregiver support      Co-evaluation               End of Session Equipment Utilized During Treatment: Gait belt Activity Tolerance: Treatment limited secondary to agitation Patient left: in bed;with  bed alarm set;with call bell/phone within reach           Time: [id number] PT Time Calculation (min) (ACUTE ONLY): 20 min   Charges:   PT Evaluation $Initial PT Evaluation Tier I: 1 Procedure     PT G Codes:        Tilley Faeth H. Manson PasseyBrown, PT, DPT 04/20/2015, 9:16 AM [phone]

## 2015-04-20 NOTE — Progress Notes (Signed)
[facility] Physicians - [place] at [facility]   PATIENT NAME: Cassandra Steele    MR#:  [id number]  DATE OF BIRTH:  Dec 03, 1935  SUBJECTIVE:  CHIEF COMPLAINT:   Chief Complaint  Patient presents with  . Altered Mental Status   Confused. Pulling on things.  REVIEW OF SYSTEMS:    ROS  Encephalopathy and dementia. Attempted but cannot obtain.   DRUG ALLERGIES:  No Known Allergies  VITALS:  Blood pressure 101/59, pulse 78, temperature 97.9 F (36.6 C), temperature source Oral, resp. rate 20, height  (1.651 m), weight 73.619 kg (162 lb 4.8 oz), SpO2 96 %.  PHYSICAL EXAMINATION:   Physical Exam  GENERAL:  79 y.o.-year-old patient lying in the bed with no acute distress. restless  EYES: Pupils equal, round, reactive to light and accommodation. No scleral icterus. Extraocular muscles intact.  HEENT: Head atraumatic, normocephalic. Oropharynx and nasopharynx clear.  NECK:  Supple, no jugular venous distention. No thyroid enlargement, no tenderness.  LUNGS: Normal breath sounds bilaterally, no wheezing, rales, rhonchi. No use of accessory muscles of respiration.  CARDIOVASCULAR: S1, S2 normal. No murmurs, rubs, or gallops.  ABDOMEN: Soft, nontender, nondistended. Bowel sounds present. No organomegaly or mass.  EXTREMITIES: No cyanosis, clubbing or edema b/l.    NEUROLOGIC: Cranial nerves II through XII are intact. No focal Motor or sensory deficits b/l.   PSYCHIATRIC: The patient is alert and confused SKIN: No obvious rash, lesion, or ulcer.    LABORATORY PANEL:   CBC  Recent Labs Lab 04/19/15 0449  WBC 12.5*  HGB 10.1*  HCT 29.1*  PLT 141*   ------------------------------------------------------------------------------------------------------------------  Chemistries   Recent Labs Lab 04/19/15 0449  NA 139  K 3.9  CL 108  CO2 26  GLUCOSE 90  BUN 16  CREATININE 1.02*  CALCIUM 8.6*  AST 34  ALT 23  ALKPHOS 41  BILITOT 1.0    ------------------------------------------------------------------------------------------------------------------  Cardiac Enzymes  Recent Labs Lab 04/18/15 1408  TROPONINI <0.03   ------------------------------------------------------------------------------------------------------------------  RADIOLOGY:  Dg Abd 1 View  04/18/2015   CLINICAL DATA:  Foul-smelling urine, abdominal pain and combativeness today.  EXAM: ABDOMEN - 1 VIEW  COMPARISON:  02/28/2015  FINDINGS: The lung bases are grossly clear. Stable mild eventration of the left hemidiaphragm. The bowel gas pattern is unremarkable and stable. Large calcified uterine fibroids are again demonstrated.  IMPRESSION: No plain film findings for an acute abdominal process.   Electronically Signed   By: Rudie Meyer M.D.   On: 04/18/2015 16:22   Dg Chest Portable 1 View  04/18/2015   CLINICAL DATA:  Vomiting and mental status changes.  EXAM: PORTABLE CHEST - 1 VIEW  COMPARISON:  One-view chest 02/28/2015  FINDINGS: The heart size is normal. Lung volumes are low, exaggerating the interstitium. There is no edema or effusion to suggest failure. No focal airspace consolidation is present. Degenerative changes are again noted in the shoulders.  IMPRESSION: 1. Low lung volumes. 2. No acute cardiopulmonary disease.   Electronically Signed   By: Marin Roberts M.D.   On: 04/18/2015 15:04     ASSESSMENT AND PLAN:   * Streptococcus UTI with severe sepsis - POA Continue IV antibiotics. Monitor urine output. IVF  * Acute encephalopathy over dementia Due to UTI, sepsis.  * HTN Hold home meds due to hypotension   All the records are reviewed and case discussed with Care Management/Social Workerr. Management plans discussed with the patient, family and they are in agreement.  CODE STATUS: FULL  CODE  DVT Prophylaxis: Lovenox  TOTAL TIME TAKING CARE OF THIS PATIENT: 35 minutes.   POSSIBLE D/C Eusebio FriendlyMORROW   Denika Krone R M.D on  04/20/2015 at 11:00 AM  Between 7am to 6pm - Pager - [phone]  After 6pm go to [email or web address] - password [email or web address] [facility]  [facility]  Office  [phone]  CC: Primary care physician; No PCP Per Patient

## 2015-04-21 MED ORDER — AMOXICILLIN 500 MG PO TABS: 500.0000 mg | ORAL_TABLET | Freq: Three times a day (TID) | ORAL | Status: DC

## 2015-04-21 NOTE — Discharge Instructions (Signed)
°  DIET:  Cardiac diet  DISCHARGE CONDITION:  Stable  ACTIVITY:  Activity as tolerated  OXYGEN:  Home Oxygen: No.   Oxygen Delivery: room air  DISCHARGE LOCATION:  group home   If you experience worsening of your admission symptoms, develop shortness of breath, life threatening emergency, suicidal or homicidal thoughts you must seek medical attention immediately by calling 911 or calling your MD immediately  if symptoms less severe.  You Must read complete instructions/literature along with all the possible adverse reactions/side effects for all the Medicines you take and that have been prescribed to you. Take any new Medicines after you have completely understood and accpet all the possible adverse reactions/side effects.   Please note  You were cared for by a hospitalist during your hospital stay. If you have any questions about your discharge medications or the care you received while you were in the hospital after you are discharged, you can call the unit and asked to speak with the hospitalist on call if the hospitalist that took care of you is not available. Once you are discharged, your primary care physician will handle any further medical issues. Please note that NO REFILLS for any discharge medications will be authorized once you are discharged, as it is imperative that you return to your primary care physician (or establish a relationship with a primary care physician if you do not have one) for your aftercare needs so that they can reassess your need for medications and monitor your lab values.

## 2015-04-21 NOTE — Clinical Social Work Note (Signed)
Pt is ready for d/c today.  CSW informed staff [facility]  CSW sent d/c documentation to the facility.  [facility]  CSW provided room and report information to RN.  CSW signing off as there are no further needs at this time.  BorondaLynn Mera Gunkel, [place]CS[place] [phone]

## 2015-04-21 NOTE — Clinical Social Work Note (Signed)
Clinical Social Work Assessment  Patient Details  Name: Cassandra Steele MRN: [id number] Date of Birth: 07/05/1936  Date of referral:  04/18/15               Reason for consult:  Facility Placement                Permission sought to share information with:  Facility Industrial/product designerContact Representative Permission granted to share information::  No  Name::     Cassandra Steele  Agency::  [facility]  Relationship::     Contact Information:     Housing/Transportation Living arrangements for the past 2 months:  Assisted Living Facility Source of Information:  Facility Patient Interpreter Needed:  None Criminal Activity/Legal Involvement Pertinent to Current Situation/Hospitalization:  No - Comment as needed Significant Relationships:  Siblings, Adult Children (Pt has a sister, Cassandra Steele, who lives in [place] but is elderly and a son who the facility does not see often.) Lives with:  Facility Resident Do you feel safe going back to the place where you live?  Yes Need for family participation in patient care:  No (Coment) (Facility did not provide contact information for son.  Pt does not have a guardian and facility makes decisions for pt.)  Care giving concerns:  PT recommended SNF if pt is able to participate.  Facility explained that pt probably will not participate in PT at a foreign facility.  Facility would like home health PT and RN to follow.   Social Worker assessment / plan:  Pt is a 79 y/o female who [facility] resides [facility]).  CSW [facility] to [facility], [facility], by phone to inquire about pt as pt is currently disoriented.  Pt has been a resident for 4 years.  Facility staff explained that pt has a sister that lives in [place] and a son who has limited contact with pt.  Facility staff has historically made decisions for pt.  [facility]  The facility is requesting home health PT and RN.  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  [facility] pt yesterday and feels that pt would respond better to PT if she were in a familiar environment.    Patient/Family's Understanding of and Emotional Response to Diagnosis, Current Treatment, and Prognosis:  Pt unable to be assessed.  F[facility] hopeful that they will be able to care for pt's needs.  Emotional Assessment Appearance:  Appears stated age Attitude/Demeanor/Rapport:  Other (Pt was disoriented but calm.) Affect (typically observed):  Quiet, Restless (Pt disoriented.) Orientation:    Alcohol / Substance use:  Not Applicable Psych involvement (Current and /or in the community):  No (Comment)  Discharge Needs  Concerns to be addressed:  Discharge Planning Concerns Readmission within the last 30 days:  No Current discharge risk:  None Barriers to Discharge:  No Barriers Identified  [facility]andra [facility] Cassandra Sakuma, LCSW [phone] [place], Cassandra Hegler D, LCSW 04/21/2015, 10:18 AM

## 2015-04-21 NOTE — Discharge Planning (Signed)
Pt will be going back to group home today, she has been discharged. Rocephin IV given x1 this shift, no complaints of pain. VSS but appetite remains poor.

## 2015-04-21 NOTE — Discharge Summary (Addendum)
[facility] [place] [facility]   PATIENT NAME: Cassandra Steele    MR#:  [id number]  DATE OF BIRTH:  06/07/1936  DATE OF ADMISSION:  04/18/2015 ADMITTING PHYSICIAN: Cassandra ArbourJeffrey D Sparks, MD  DATE OF DISCHARGE: 04/21/2015  PRIMARY CARE PHYSICIAN: No PCP Per Patient    ADMISSION DIAGNOSIS:  Abdominal pain [R10.9] Severe sepsis [A41.9, R65.20]  DISCHARGE DIAGNOSIS:  Principal Problem:   Sepsis Active Problems:   Acute UTI   Delirium superimposed on dementia   HTN, goal below 140/90   SECONDARY DIAGNOSIS:   Past Medical History  Diagnosis Date  . Hypertension   . Dementia      ADMITTING HISTORY  HPI: Cassandra Steele is a 79 y.o. female has a past medical history significant for Dementia and HTN sent to ER from family care home with lethargy, confusion, and vomiting. Febrile in ER with tachycardia. UTI noted. Pt confused and unable to give hx. No family of family care home staff present. Now admitted for sepsis from urinary source.   HOSPITAL COURSE:   * Streptococcus UTI with severe sepsis - POA Continued IV antibiotics.  Change to amoxicillin at discharge  * Acute encephalopathy over dementia - improving Due to UTI, sepsis.  * HTN Hold home meds due to hypotension  Return to Group home. Stable for discharge.  CONSULTS OBTAINED:     DRUG ALLERGIES:  No Known Allergies  DISCHARGE MEDICATIONS:   Current Discharge Medication List    START taking these medications   Details  amoxicillin (AMOXIL) 500 MG tablet Take 1 tablet (500 mg total) by mouth 3 (three) times daily. Qty: 12 tablet, Refills: 0      CONTINUE these medications which have NOT CHANGED   Details  acetaminophen (TYLENOL) 325 MG tablet Take 650 mg by mouth 2 (two) times daily.    aspirin 81 MG chewable tablet Chew 81 mg by mouth daily.    Calcium Carbonate-Vitamin D 600-400 MG-UNIT per tablet Take 1 tablet by mouth 2 (two) times daily.    citalopram (CELEXA) 40 MG  tablet Take 40 mg by mouth at bedtime.    diclofenac sodium (VOLTAREN) 1 % GEL Apply 1 application topically 2 (two) times daily as needed (Apply to affected hips and knees twice a day as needed for joint pains.).     haloperidol (HALDOL) 0.5 MG tablet Take 0.5 mg by mouth See admin instructions. Take 1 tablet orally once a day and Take 1 tablet orally every 6 hours as needed for agitation.    !! hydrocortisone (ANUSOL-HC) 2.5 % rectal cream Place 1 application rectally daily as needed for hemorrhoids or itching.    !! hydrocortisone 2.5 % cream Apply 1 application topically 2 (two) times daily as needed. For rash.    hydrOXYzine (ATARAX/VISTARIL) 25 MG tablet Take 25 mg by mouth every 6 (six) hours as needed for itching (or agitation.).    levothyroxine (SYNTHROID, LEVOTHROID) 75 MCG tablet Take 75 mcg by mouth daily.    LORazepam (ATIVAN) 0.5 MG tablet Take 0.5 mg by mouth every 8 (eight) hours as needed (for agitation.).     pravastatin (PRAVACHOL) 10 MG tablet Take 10 mg by mouth at bedtime.    VESICARE 5 MG tablet Take 5 mg by mouth every evening.     !! - Potential duplicate medications found. Please discuss with provider.      Today    VITAL SIGNS:  Blood pressure 115/66, pulse 71, temperature 97.8 F (36.6 C), temperature  source Oral, resp. rate 18, height  (1.651 m), weight 70.081 kg (154 lb 8 oz), SpO2 99 %.  I/O:   Intake/Output Summary (Last 24 hours) at 04/21/15 1204 Last data filed at 04/20/15 1841  Gross per 24 hour  Intake 716.67 ml  Output      0 ml  Net 716.67 ml    PHYSICAL EXAMINATION:  Physical Exam  GENERAL:  79 y.o.-year-old patient lying in the bed with no acute distress.  LUNGS: Normal breath sounds bilaterally, no wheezing, rales,rhonchi or crepitation. No use of accessory muscles of respiration.  CARDIOVASCULAR: S1, S2 normal. No murmurs, rubs, or gallops.  ABDOMEN: Soft, non-tender, non-distended. Bowel sounds present. No organomegaly or  mass.  PSYCHIATRIC: The patient is confused  DATA REVIEW:   CBC  Recent Labs Lab 04/19/15 0449  WBC 12.5*  HGB 10.1*  HCT 29.1*  PLT 141*    Chemistries   Recent Labs Lab 04/19/15 0449  NA 139  K 3.9  CL 108  CO2 26  GLUCOSE 90  BUN 16  CREATININE 1.02*  CALCIUM 8.6*  AST 34  ALT 23  ALKPHOS 41  BILITOT 1.0    Cardiac Enzymes  Recent Labs Lab 04/18/15 1408  TROPONINI <0.03    Microbiology Results  Results for orders placed or performed during the hospital encounter of 04/18/15  Culture, blood (routine x 2)     Status: None (Preliminary result)   Collection Time: 04/18/15  2:10 PM  Result Value Ref Range Status   Specimen Description BLOOD  Final   Special Requests NONE  Final   Culture NO GROWTH 3 DAYS  Final   Report Status PENDING  Incomplete  Culture, blood (routine x 2)     Status: None (Preliminary result)   Collection Time: 04/18/15  2:11 PM  Result Value Ref Range Status   Specimen Description BLOOD  Final   Special Requests NONE  Final   Culture NO GROWTH 3 DAYS  Final   Report Status PENDING  Incomplete  Urine culture     Status: None   Collection Time: 04/18/15  2:12 PM  Result Value Ref Range Status   Specimen Description URINE, CLEAN CATCH  Final   Special Requests NONE  Final   Culture   Final    >=100,000 COLONIES/mL STREPTOCOCCUS AGALACTIAE Virtually 100% of S. agalactiae (Group B) strains are susceptible to Penicillin.  For Penicillin-allergic patients, Erythromycin (85-95% sensitive) and Clindamycin (80% sensitive) are drugs of choice. Contact microbiology lab to request sensitivities if  needed within 7 days.    Report Status 04/19/2015 FINAL  Final  MRSA PCR Screening     Status: None   Collection Time: 04/19/15  6:24 AM  Result Value Ref Range Status   MRSA by PCR NEGATIVE NEGATIVE Final    Comment:        The GeneXpert MRSA Assay (FDA approved for NASAL specimens only), is one component of a comprehensive MRSA  colonization surveillance program. It is not intended to diagnose MRSA infection nor to guide or monitor treatment for MRSA infections.     RADIOLOGY:  No results found.    Follow up with PCP in 1 week.  Management plans discussed with the patient, family and they are in agreement.  CODE STATUS:     Code Status Orders        Start     Ordered   04/18/15 1637  Full code   Continuous     04/18/15 1636  TOTAL TIME TAKING CARE OF THIS PATIENT ON DAY OF DISCHARGE: more than 30  minutes.    Milagros Loll R M.D on 04/21/2015 at 12:04 PM  Between 7am to 6pm - Pager - [phone]  After 6pm go to [email or web address] - password [email or web address] [facility]  [facility]  Office  [phone]  CC: Primary care physician; No PCP Per Patient

## 2015-04-21 NOTE — Plan of Care (Signed)
Problem: Discharge Progression Outcomes Goal: Other Discharge Outcomes/Goals Outcome: Progressing Plan of care progress to goal Pain: No c/o or s/sx of discomfort. Hemodynamically stable: Pt vitals stable. IVF discontinued yesterday. Activity appropriate: Pt at baseline. Has hx of dementia. Incontinent of bowel and bladder. High fall risk. Bed alarm in place with room adjacent to the nurses station.

## 2015-04-21 NOTE — Care Management (Signed)
[facility]charge [facility] today per Dr. Elpidio AnisSudini.  [facility]. Delorse LekLynn Washington, Social Worker [facility]. Would like [facility]. Feliberto GottronJason Hinton, representative for [facility]. Gwenette GreetBrenda S Holland RN MSN Care Management [phone]

## 2015-04-23 LAB — CULTURE, BLOOD (ROUTINE X 2)
Culture: NO GROWTH
Culture: NO GROWTH

## 2015-09-01 ENCOUNTER — Emergency Department
Admission: EM | Admit: 2015-09-01 | Discharge: 2015-09-01 | Disposition: A | Discharge status: Home / Self Care | Payer: Medicare Other | Attending: Emergency Medicine | Admitting: Emergency Medicine

## 2015-09-01 ENCOUNTER — Encounter: Payer: Self-pay | Admitting: Intensive Care

## 2015-09-01 DIAGNOSIS — I10 Essential (primary) hypertension: Secondary | ICD-10-CM | POA: Insufficient documentation

## 2015-09-01 DIAGNOSIS — Z79899 Other long term (current) drug therapy: Secondary | ICD-10-CM | POA: Diagnosis not present

## 2015-09-01 DIAGNOSIS — S01511A Laceration without foreign body of lip, initial encounter: Secondary | ICD-10-CM | POA: Insufficient documentation

## 2015-09-01 DIAGNOSIS — Z792 Long term (current) use of antibiotics: Secondary | ICD-10-CM | POA: Insufficient documentation

## 2015-09-01 DIAGNOSIS — Y92129 Unspecified place in nursing home as the place of occurrence of the external cause: Secondary | ICD-10-CM | POA: Diagnosis not present

## 2015-09-01 DIAGNOSIS — W19XXXA Unspecified fall, initial encounter: Secondary | ICD-10-CM | POA: Diagnosis not present

## 2015-09-01 DIAGNOSIS — Y9389 Activity, other specified: Secondary | ICD-10-CM | POA: Diagnosis not present

## 2015-09-01 DIAGNOSIS — Z7982 Long term (current) use of aspirin: Secondary | ICD-10-CM | POA: Insufficient documentation

## 2015-09-01 DIAGNOSIS — Y998 Other external cause status: Secondary | ICD-10-CM | POA: Diagnosis not present

## 2015-09-01 HISTORY — DX: Mixed incontinence: N39.46

## 2015-09-01 HISTORY — DX: Alzheimer's disease, unspecified: G30.9

## 2015-09-01 HISTORY — DX: Anemia, unspecified: D64.9

## 2015-09-01 HISTORY — DX: Dementia in other diseases classified elsewhere, unspecified severity, without behavioral disturbance, psychotic disturbance, mood disturbance, and anxiety: F02.80

## 2015-09-01 HISTORY — DX: Major depressive disorder, single episode, unspecified: F32.9

## 2015-09-01 HISTORY — DX: Depression, unspecified: F32.A

## 2015-09-01 HISTORY — DX: Disorder of thyroid, unspecified: E07.9

## 2015-09-01 HISTORY — DX: Unspecified osteoarthritis, unspecified site: M19.90

## 2015-09-01 NOTE — ED Notes (Signed)
Patient arrived by EMS from [facility]. Patient has HX of alzheimers disease and not able to answer questions. Patient has small laceration on middle upper lip. No drainage noted at this time.

## 2015-09-01 NOTE — ED Provider Notes (Signed)
[facility] Emergency Department Provider Note  ____________________________________________  Time seen: 8:05 AM  I have reviewed the triage vital signs and the nursing notes.  Level V caveat - patient with dementia and unable to provide details of the events today or history. Review of systems not possible.  HISTORY  Chief Complaint  fall, laceration to upper lip    HPI Cassandra Steele is a 79 y.o. female with dementia who stays in a family care home. EMS was called to the care home because the patient had a mechanical fall and sustained a small laceration to her upper lip. The group home wish to have her evaluated for this laceration and any other possible injury.  EMS reports that the patient had tripped. The care home had denied any loss of consciousness. The patient is acting in her usual manner. EMS reports that she will be calm and cooperative one moment but then cursing and insulting to caretakers another.  No acute injuries, other than the laceration to the upper lip, were noted by the care home or by EMS.   Past Medical History  Diagnosis Date  . Hypertension   . Dementia   . Alzheimer's disease   . Thyroid disease   . Anemia   . Osteoarthritis   . Depression   . Mixed incontinence     Patient Active Problem List   Diagnosis Date Noted  . Sepsis (HCC) 04/18/2015  . Acute UTI 04/18/2015  . Delirium superimposed on dementia 04/18/2015  . HTN, goal below 140/90 04/18/2015    History reviewed. No pertinent past surgical history.  Current Outpatient Rx  Name  Route  Sig  Dispense  Refill  . acetaminophen (TYLENOL) 325 MG tablet   Oral   Take 650 mg by mouth 2 (two) times daily.         Marland Kitchen amoxicillin (AMOXIL) 500 MG tablet   Oral   Take 1 tablet (500 mg total) by mouth 3 (three) times daily.   12 tablet   0   . aspirin 81 MG chewable tablet   Oral   Chew 81 mg by mouth daily.         . Calcium Carbonate-Vitamin D 600-400  MG-UNIT per tablet   Oral   Take 1 tablet by mouth 2 (two) times daily.         . citalopram (CELEXA) 40 MG tablet   Oral   Take 40 mg by mouth at bedtime.         . diclofenac sodium (VOLTAREN) 1 % GEL   Topical   Apply 1 application topically 2 (two) times daily as needed (Apply to affected hips and knees twice a day as needed for joint pains.).          Marland Kitchen haloperidol (HALDOL) 0.5 MG tablet   Oral   Take 0.5 mg by mouth See admin instructions. Take 1 tablet orally once a day and Take 1 tablet orally every 6 hours as needed for agitation.         . hydrocortisone (ANUSOL-HC) 2.5 % rectal cream   Rectal   Place 1 application rectally daily as needed for hemorrhoids or itching.         . hydrocortisone 2.5 % cream   Topical   Apply 1 application topically 2 (two) times daily as needed. For rash.         . hydrOXYzine (ATARAX/VISTARIL) 25 MG tablet   Oral   Take 25 mg by mouth  every 6 (six) hours as needed for itching (or agitation.).         Marland Kitchen levothyroxine (SYNTHROID, LEVOTHROID) 75 MCG tablet   Oral   Take 75 mcg by mouth daily.         Marland Kitchen LORazepam (ATIVAN) 0.5 MG tablet   Oral   Take 0.5 mg by mouth every 8 (eight) hours as needed (for agitation.).          Marland Kitchen pravastatin (PRAVACHOL) 10 MG tablet   Oral   Take 10 mg by mouth at bedtime.         . VESICARE 5 MG tablet   Oral   Take 5 mg by mouth every evening.           Dispense as written.     Allergies Review of patient's allergies indicates no known allergies.  Family History  Problem Relation Age of Onset  . Family history unknown: Yes    Social History Social History  Substance Use Topics  . Smoking status: Never Smoker   . Smokeless tobacco: None  . Alcohol Use: No    Review of Systems Review of systems not possible due to dementia and cognitive and communication difficulties.  ____________________________________________   PHYSICAL EXAM:  VITAL SIGNS: ED Triage  Vitals  Enc Vitals Group     BP --      Pulse --      Resp --      Temp --      Temp src --      SpO2 --      Weight --      Height --      Head Cir --      Peak Flow --      Pain Score --      Pain Loc --      Pain Edu? --      Excl. in GC? --     Constitutional:  Alert. Not oriented to time or place. Patient was initially quiet and calm, but then became quite resistant to examination and combative. She would calm down quickly once no further interaction or examination was performed. ENT   Head: Normocephalic and atraumatic.   Nose: No congestion/rhinnorhea.         Mouth:  There is a small laceration to the middle of the upper lip. This cut is less than a centimeter in length. Bleeding is controlled. Cervical: Nontender. Full range of motion. Cardiovascular: Normal rate, regular rhythm, no murmur noted Respiratory:  Normal respiratory effort, no tachypnea.    Breath sounds are clear and equal bilaterally.  Gastrointestinal: Soft and nontender. No distention. This portion of the examination was done while the patient was distracted and I feel I was able to get an accurate examination of her abdomen. Back: No muscle spasm, no tenderness, no CVA tenderness. Musculoskeletal: No deformity noted. Patient is somewhat combative, with excellent strength and no limitation in the use of her extremities. Pelvis is stable. Nontender with normal range of motion in all extremities.  No noted edema. Neurologic:  Normal speech and language. No gross focal neurologic deficits are appreciated.  Skin:  Skin is warm, dry. No rash noted. There is a laceration of the upper lip. She mouth area above. No ongoing bleeding. Psychiatric: Patient was initially calm. She was treated in the most gentle way possible, but despite this, she started to become somewhat agitated. She started complaining about why we came to her. She does not appear  to be oriented to time place of the purpose of her visit. She did  not tolerate the oxygen saturation sensor. While trying to obtain an initial oxygen saturation level, she began clawing at the hands of the providers. During the examination of the injury to her upper lip, the patient became more combative and then began spitting and grabbing and clawing at the physician and nurse. ____________________________________________ ____________________________________________   INITIAL IMPRESSION / ASSESSMENT AND PLAN / ED COURSE  Pertinent labs & imaging results that were available during my care of the patient were reviewed by me and considered in my medical decision making (see chart for details).  [id number] female with some agitation, combativeness, secondary to dementia, with a mechanical fall with laceration to her upper lip. The laceration is relatively small. I believe it'll heal well without any closure, although in an ideal situation we would place one or 2 stitches. The difficulty is at the patient will not tolerate oral lower for such a repair unless we use moderate sedation. Given her age and comorbidities, the use of moderate sedation for this very small laceration does not seem appropriate.  The patient appears well otherwise. She has no other noted physical injury. She has no dysfunction of her extremities. There is no deformity. Her neck appears nontender. She moves well both when she is not combative as well as when she is. We will return her to the care home for her usual ongoing care.  ____________________________________________   FINAL CLINICAL IMPRESSION(S) / ED DIAGNOSES  Final diagnoses:  Fall, initial encounter  Laceration of lip, initial encounter      Darien Ramusavid W Kourtnee Lahey, MD 09/01/15 [phone]

## 2015-09-01 NOTE — Discharge Instructions (Signed)
The laceration of the upper lip is relatively small. While ideally it would've been closed with one or 2 stitches, the patient's combative nature will not allow for this without sedation, and we do not feel that this is appropriate for a small wound that will heal on its own.  Keep the wound clean. Attempt to remove any debris after meals. Return to the emergency department if the upper lip swells or becomes more red or if you have other urgent concerns.  Laceration Care, Adult A laceration is a cut that goes through all layers of the skin. The cut also goes into the tissue that is right under the skin. Some cuts heal on their own. Others need to be closed with stitches (sutures), staples, skin adhesive strips, or wound glue. Taking care of your cut lowers your risk of infection and helps your cut to heal better. HOW TO TAKE CARE OF YOUR CUT For stitches or staples:  Keep the wound clean and dry.  If you were given a bandage (dressing), you should change it at least one time per day or as told by your doctor. You should also change it if it gets wet or dirty.  Keep the wound completely dry for the first 24 hours or as told by your doctor. After that time, you may take a shower or a bath. However, make sure that the wound is not soaked in water until after the stitches or staples have been removed.  Clean the wound one time each day or as told by your doctor:  Wash the wound with soap and water.  Rinse the wound with water until all of the soap comes off.  Pat the wound dry with a clean towel. Do not rub the wound.  After you clean the wound, put a thin layer of antibiotic ointment on it as told by your doctor. This ointment:  Helps to prevent infection.  Keeps the bandage from sticking to the wound.  Have your stitches or staples removed as told by your doctor. If your doctor used skin adhesive strips:   Keep the wound clean and dry.  If you were given a bandage, you should change it  at least one time per day or as told by your doctor. You should also change it if it gets dirty or wet.  Do not get the skin adhesive strips wet. You can take a shower or a bath, but be careful to keep the wound dry.  If the wound gets wet, pat it dry with a clean towel. Do not rub the wound.  Skin adhesive strips fall off on their own. You can trim the strips as the wound heals. Do not remove any strips that are still stuck to the wound. They will fall off after a while. If your doctor used wound glue:  Try to keep your wound dry, but you may briefly wet it in the shower or bath. Do not soak the wound in water, such as by swimming.  After you take a shower or a bath, gently pat the wound dry with a clean towel. Do not rub the wound.  Do not do any activities that will make you really sweaty until the skin glue has fallen off on its own.  Do not apply liquid, cream, or ointment medicine to your wound while the skin glue is still on.  If you were given a bandage, you should change it at least one time per day or as told by your  doctor. You should also change it if it gets dirty or wet.  If a bandage is placed over the wound, do not let the tape for the bandage touch the skin glue.  Do not pick at the glue. The skin glue usually stays on for 5-10 days. Then, it falls off of the skin. General Instructions  To help prevent scarring, make sure to cover your wound with sunscreen whenever you are outside after stitches are removed, after adhesive strips are removed, or when wound glue stays in place and the wound is healed. Make sure to wear a sunscreen of at least 30 SPF.  Take over-the-counter and prescription medicines only as told by your doctor.  If you were given antibiotic medicine or ointment, take or apply it as told by your doctor. Do not stop using the antibiotic even if your wound is getting better.  Do not scratch or pick at the wound.  Keep all follow-up visits as told by your  doctor. This is important.  Check your wound every day for signs of infection. Watch for:  Redness, swelling, or pain.  Fluid, blood, or pus.  Raise (elevate) the injured area above the level of your heart while you are sitting or lying down, if possible. GET HELP IF:  You got a tetanus shot and you have any of these problems at the injection site:  Swelling.  Very bad pain.  Redness.  Bleeding.  You have a fever.  A wound that was closed breaks open.  You notice a bad smell coming from your wound or your bandage.  You notice something coming out of the wound, such as wood or glass.  Medicine does not help your pain.  You have more redness, swelling, or pain at the site of your wound.  You have fluid, blood, or pus coming from your wound.  You notice a change in the color of your skin near your wound.  You need to change the bandage often because fluid, blood, or pus is coming from the wound.  You start to have a new rash.  You start to have numbness around the wound. GET HELP RIGHT AWAY IF:  You have very bad swelling around the wound.  Your pain suddenly gets worse and is very bad.  You notice painful lumps near the wound or on skin that is anywhere on your body.  You have a red streak going away from your wound.  The wound is on your hand or foot and you cannot move a finger or toe like you usually can.  The wound is on your hand or foot and you notice that your fingers or toes look pale or bluish.   This information is not intended to replace advice given to you by your health care provider. Make sure you discuss any questions you have with your health care provider.   Document Released: 04/17/2008 Document Revised: 03/16/2015 Document Reviewed: 10/26/2014 Elsevier Interactive Patient Education [employer]2016 Elsevier Inc.

## 2015-10-03 ENCOUNTER — Emergency Department: Payer: Medicare Other

## 2015-10-03 ENCOUNTER — Inpatient Hospital Stay
Admission: EM | Admit: 2015-10-03 | Discharge: 2015-10-06 | DRG: 689 | Disposition: A | Discharge status: Skilled Nursing Facility | Payer: Medicare Other | Attending: Internal Medicine | Admitting: Internal Medicine

## 2015-10-03 ENCOUNTER — Encounter: Payer: Self-pay | Admitting: Emergency Medicine

## 2015-10-03 DIAGNOSIS — R5383 Other fatigue: Secondary | ICD-10-CM | POA: Diagnosis present

## 2015-10-03 DIAGNOSIS — M1990 Unspecified osteoarthritis, unspecified site: Secondary | ICD-10-CM | POA: Diagnosis present

## 2015-10-03 DIAGNOSIS — Z7982 Long term (current) use of aspirin: Secondary | ICD-10-CM

## 2015-10-03 DIAGNOSIS — Z87440 Personal history of urinary (tract) infections: Secondary | ICD-10-CM | POA: Diagnosis not present

## 2015-10-03 DIAGNOSIS — G309 Alzheimer's disease, unspecified: Secondary | ICD-10-CM | POA: Diagnosis present

## 2015-10-03 DIAGNOSIS — D696 Thrombocytopenia, unspecified: Secondary | ICD-10-CM | POA: Diagnosis present

## 2015-10-03 DIAGNOSIS — N390 Urinary tract infection, site not specified: Secondary | ICD-10-CM | POA: Diagnosis not present

## 2015-10-03 DIAGNOSIS — G9341 Metabolic encephalopathy: Secondary | ICD-10-CM | POA: Diagnosis present

## 2015-10-03 DIAGNOSIS — F0280 Dementia in other diseases classified elsewhere without behavioral disturbance: Secondary | ICD-10-CM | POA: Diagnosis present

## 2015-10-03 DIAGNOSIS — F329 Major depressive disorder, single episode, unspecified: Secondary | ICD-10-CM | POA: Diagnosis present

## 2015-10-03 DIAGNOSIS — E440 Moderate protein-calorie malnutrition: Secondary | ICD-10-CM | POA: Diagnosis present

## 2015-10-03 DIAGNOSIS — D649 Anemia, unspecified: Secondary | ICD-10-CM | POA: Diagnosis present

## 2015-10-03 DIAGNOSIS — Z6825 Body mass index (BMI) 25.0-25.9, adult: Secondary | ICD-10-CM | POA: Diagnosis not present

## 2015-10-03 DIAGNOSIS — I10 Essential (primary) hypertension: Secondary | ICD-10-CM | POA: Diagnosis present

## 2015-10-03 DIAGNOSIS — N3000 Acute cystitis without hematuria: Principal | ICD-10-CM | POA: Diagnosis present

## 2015-10-03 DIAGNOSIS — R4182 Altered mental status, unspecified: Secondary | ICD-10-CM | POA: Diagnosis not present

## 2015-10-03 DIAGNOSIS — Z1612 Extended spectrum beta lactamase (ESBL) resistance: Secondary | ICD-10-CM | POA: Diagnosis present

## 2015-10-03 DIAGNOSIS — F0390 Unspecified dementia without behavioral disturbance: Secondary | ICD-10-CM | POA: Diagnosis not present

## 2015-10-03 DIAGNOSIS — A419 Sepsis, unspecified organism: Secondary | ICD-10-CM

## 2015-10-03 DIAGNOSIS — Z79899 Other long term (current) drug therapy: Secondary | ICD-10-CM | POA: Diagnosis not present

## 2015-10-03 DIAGNOSIS — Z515 Encounter for palliative care: Secondary | ICD-10-CM | POA: Diagnosis present

## 2015-10-03 DIAGNOSIS — R6510 Systemic inflammatory response syndrome (SIRS) of non-infectious origin without acute organ dysfunction: Secondary | ICD-10-CM

## 2015-10-03 LAB — COMPREHENSIVE METABOLIC PANEL
ALBUMIN: 3.9 g/dL (ref 3.5–5.0)
ALK PHOS: 54 U/L (ref 38–126)
ALT: 44 U/L (ref 14–54)
ANION GAP: 11 (ref 5–15)
AST: 114 U/L — ABNORMAL HIGH (ref 15–41)
BUN: 28 mg/dL — ABNORMAL HIGH (ref 6–20)
CALCIUM: 10.2 mg/dL (ref 8.9–10.3)
CHLORIDE: 103 mmol/L (ref 101–111)
CO2: 24 mmol/L (ref 22–32)
Creatinine, Ser: 1.37 mg/dL — ABNORMAL HIGH (ref 0.44–1.00)
GFR calc non Af Amer: 36 mL/min — ABNORMAL LOW (ref >60)
GFR, EST AFRICAN AMERICAN: 42 mL/min — AB (ref >60)
GLUCOSE: 131 mg/dL — AB (ref 65–99)
POTASSIUM: 3.9 mmol/L (ref 3.5–5.1)
SODIUM: 138 mmol/L (ref 135–145)
Total Bilirubin: 0.7 mg/dL (ref 0.3–1.2)
Total Protein: 8.8 g/dL — ABNORMAL HIGH (ref 6.5–8.1)

## 2015-10-03 LAB — URINALYSIS COMPLETE WITH MICROSCOPIC (ARMC ONLY)
Bilirubin Urine: NEGATIVE
Glucose, UA: NEGATIVE mg/dL
KETONES UR: NEGATIVE mg/dL
Nitrite: POSITIVE — AB
PH: 5 (ref 5.0–8.0)
PROTEIN: 30 mg/dL — AB
Specific Gravity, Urine: 1.021 (ref 1.005–1.030)

## 2015-10-03 LAB — CBC WITH DIFFERENTIAL/PLATELET
Basophils Absolute: 0 10*3/uL (ref 0–0.1)
Basophils Relative: 0 %
EOS ABS: 0.5 10*3/uL (ref 0–0.7)
Eosinophils Relative: 4 %
HEMATOCRIT: 37.1 % (ref 35.0–47.0)
HEMOGLOBIN: 12.7 g/dL (ref 12.0–16.0)
LYMPHS ABS: 0.4 10*3/uL — AB (ref 1.0–3.6)
LYMPHS PCT: 4 %
MCH: 31 pg (ref 26.0–34.0)
MCHC: 34.3 g/dL (ref 32.0–36.0)
MCV: 90.5 fL (ref 80.0–100.0)
MONOS PCT: 4 %
Monocytes Absolute: 0.5 10*3/uL (ref 0.2–0.9)
NEUTROS ABS: 9.9 10*3/uL — AB (ref 1.4–6.5)
NEUTROS PCT: 88 %
Platelets: 151 10*3/uL (ref 150–440)
RBC: 4.11 MIL/uL (ref 3.80–5.20)
RDW: 13.4 % (ref 11.5–14.5)
WBC: 11.3 10*3/uL — AB (ref 3.6–11.0)

## 2015-10-03 LAB — LACTIC ACID, PLASMA
LACTIC ACID, VENOUS: 2 mmol/L (ref 0.5–2.0)
Lactic Acid, Venous: 5 mmol/L (ref 0.5–2.0)

## 2015-10-03 LAB — TROPONIN I: Troponin I: <0.03 ng/mL (ref <0.031)

## 2015-10-03 LAB — GLUCOSE, CAPILLARY: GLUCOSE-CAPILLARY: 116 mg/dL — AB (ref 65–99)

## 2015-10-03 MED ORDER — HYDROXYZINE HCL 25 MG PO TABS: 25.0000 mg | ORAL_TABLET | Freq: Four times a day (QID) | ORAL | Status: DC | PRN

## 2015-10-03 MED ORDER — SODIUM CHLORIDE 0.9 % IV BOLUS (SEPSIS)
1000.0000 mL | Freq: Once | INTRAVENOUS | Status: AC
  Administered 2015-10-03: 1000 mL via INTRAVENOUS

## 2015-10-03 MED ORDER — PIPERACILLIN-TAZOBACTAM 3.375 G IVPB 30 MIN
3.3750 g | Freq: Once | INTRAVENOUS | Status: AC
  Administered 2015-10-03: 3.375 g via INTRAVENOUS
  Filled 2015-10-03: qty 50

## 2015-10-03 MED ORDER — MORPHINE SULFATE (PF) 2 MG/ML IV SOLN
2.0000 mg | INTRAVENOUS | Status: DC | PRN
Start: 2015-10-03 — End: 2015-10-06

## 2015-10-03 MED ORDER — SODIUM CHLORIDE 0.9 % IV BOLUS (SEPSIS): 1000.0000 mL | INTRAVENOUS | Status: DC

## 2015-10-03 MED ORDER — SODIUM CHLORIDE 0.9 % IV SOLN
INTRAVENOUS | Status: DC
  Administered 2015-10-03 – 2015-10-04 (×3): via INTRAVENOUS

## 2015-10-03 MED ORDER — ASPIRIN 81 MG PO CHEW
81.0000 mg | CHEWABLE_TABLET | Freq: Every day | ORAL | Status: DC
  Administered 2015-10-04 – 2015-10-06 (×3): 81 mg via ORAL
  Filled 2015-10-03 (×4): qty 1

## 2015-10-03 MED ORDER — VANCOMYCIN HCL IN DEXTROSE 1-5 GM/200ML-% IV SOLN
1000.0000 mg | INTRAVENOUS | Status: DC
  Administered 2015-10-04 – 2015-10-06 (×3): 1000 mg via INTRAVENOUS
  Filled 2015-10-03 (×4): qty 200

## 2015-10-03 MED ORDER — PIPERACILLIN-TAZOBACTAM 3.375 G IVPB
3.3750 g | Freq: Three times a day (TID) | INTRAVENOUS | Status: DC
  Administered 2015-10-04 – 2015-10-06 (×6): 3.375 g via INTRAVENOUS
  Filled 2015-10-03 (×13): qty 50

## 2015-10-03 MED ORDER — SODIUM CHLORIDE 0.9 % IV BOLUS (SEPSIS): 500.0000 mL | INTRAVENOUS | Status: DC

## 2015-10-03 MED ORDER — HALOPERIDOL LACTATE 5 MG/ML IJ SOLN: 1.0000 mg | Freq: Four times a day (QID) | INTRAMUSCULAR | Status: DC | PRN

## 2015-10-03 MED ORDER — BISACODYL 10 MG RE SUPP: 10.0000 mg | Freq: Every day | RECTAL | Status: DC | PRN

## 2015-10-03 MED ORDER — LEVOTHYROXINE SODIUM 75 MCG PO TABS
75.0000 ug | ORAL_TABLET | Freq: Every day | ORAL | Status: DC
  Administered 2015-10-04 – 2015-10-06 (×3): 75 ug via ORAL
  Filled 2015-10-03 (×4): qty 1

## 2015-10-03 MED ORDER — ONDANSETRON HCL 4 MG PO TABS: 4.0000 mg | ORAL_TABLET | Freq: Four times a day (QID) | ORAL | Status: DC | PRN

## 2015-10-03 MED ORDER — VANCOMYCIN HCL IN DEXTROSE 1-5 GM/200ML-% IV SOLN
1000.0000 mg | Freq: Once | INTRAVENOUS | Status: AC
  Administered 2015-10-03: 1000 mg via INTRAVENOUS
  Filled 2015-10-03: qty 200

## 2015-10-03 MED ORDER — DOCUSATE SODIUM 100 MG PO CAPS
100.0000 mg | ORAL_CAPSULE | Freq: Two times a day (BID) | ORAL | Status: DC
  Administered 2015-10-04 – 2015-10-06 (×5): 100 mg via ORAL
  Filled 2015-10-03 (×5): qty 1

## 2015-10-03 MED ORDER — ACETAMINOPHEN 325 MG PO TABS: 650.0000 mg | ORAL_TABLET | Freq: Four times a day (QID) | ORAL | Status: DC | PRN

## 2015-10-03 MED ORDER — PRAVASTATIN SODIUM 20 MG PO TABS
10.0000 mg | ORAL_TABLET | Freq: Every day | ORAL | Status: DC
  Administered 2015-10-04 – 2015-10-05 (×2): 10 mg via ORAL
  Filled 2015-10-03 (×2): qty 1

## 2015-10-03 MED ORDER — SODIUM CHLORIDE 0.9 % IV BOLUS (SEPSIS)
2100.0000 mL | Freq: Once | INTRAVENOUS | Status: AC
  Administered 2015-10-03: 2100 mL via INTRAVENOUS

## 2015-10-03 MED ORDER — LORAZEPAM 0.5 MG PO TABS: 0.5000 mg | ORAL_TABLET | Freq: Three times a day (TID) | ORAL | Status: DC | PRN

## 2015-10-03 MED ORDER — DARIFENACIN HYDROBROMIDE ER 7.5 MG PO TB24
7.5000 mg | ORAL_TABLET | Freq: Every day | ORAL | Status: DC
  Administered 2015-10-04 – 2015-10-06 (×3): 7.5 mg via ORAL
  Filled 2015-10-03 (×4): qty 1

## 2015-10-03 MED ORDER — CITALOPRAM HYDROBROMIDE 20 MG PO TABS
40.0000 mg | ORAL_TABLET | Freq: Every day | ORAL | Status: DC
  Administered 2015-10-04 – 2015-10-05 (×2): 40 mg via ORAL
  Filled 2015-10-03 (×2): qty 2

## 2015-10-03 MED ORDER — ONDANSETRON HCL 4 MG/2ML IJ SOLN: 4.0000 mg | Freq: Four times a day (QID) | INTRAMUSCULAR | Status: DC | PRN

## 2015-10-03 MED ORDER — ACETAMINOPHEN 650 MG RE SUPP: 650.0000 mg | Freq: Four times a day (QID) | RECTAL | Status: DC | PRN

## 2015-10-03 MED ORDER — HEPARIN SODIUM (PORCINE) 5000 UNIT/ML IJ SOLN
5000.0000 [IU] | Freq: Three times a day (TID) | INTRAMUSCULAR | Status: DC
  Administered 2015-10-04 – 2015-10-06 (×7): 5000 [IU] via SUBCUTANEOUS
  Filled 2015-10-03 (×8): qty 1

## 2015-10-03 MED ORDER — ACETAMINOPHEN 650 MG RE SUPP
975.0000 mg | Freq: Once | RECTAL | Status: AC
  Administered 2015-10-03: 975 mg via RECTAL
  Filled 2015-10-03: qty 1

## 2015-10-03 NOTE — ED Notes (Signed)
[name] Contact Information:  Facility: [phone] Liborio NixonJanice Oncologist(Facility Director): [phone]

## 2015-10-03 NOTE — Progress Notes (Signed)
ANTIBIOTIC CONSULT NOTE - INITIAL  Pharmacy Consult for Vancomycin/Zosyn Indication: rule out sepsis  No Known Allergies  Patient Measurements: Height: 5\' 4"  (162.6 cm) Weight: 154 lb (69.854 kg) IBW/kg (Calculated) : 54.7 Adjusted Body Weight: 60.8 kg  Vital Signs: Temp: 99.3 F (37.4 C) (11/20 2115) Temp Source: Rectal (11/20 1825) BP: 97/65 mmHg (11/20 2115) Pulse Rate: 106 (11/20 2115) Intake/Output from previous day:   Intake/Output from this shift: Total I/O In: 2350 [I.V.:2350] Out: 350 [Urine:350]  Labs:  Recent Labs  10/03/15 1835  WBC 11.3*  HGB 12.7  PLT 151  CREATININE 1.37*   Estimated Creatinine Clearance: 33.5 mL/min (by C-G formula based on Cr of 1.37). No results for input(s): VANCOTROUGH, VANCOPEAK, VANCORANDOM, GENTTROUGH, GENTPEAK, GENTRANDOM, TOBRATROUGH, TOBRAPEAK, TOBRARND, AMIKACINPEAK, AMIKACINTROU, AMIKACIN in the last 72 hours.   Microbiology: No results found for this or any previous visit (from the past 720 hour(s)).  Medical History: Past Medical History  Diagnosis Date  . Hypertension   . Dementia   . Alzheimer's disease   . Thyroid disease   . Anemia   . Osteoarthritis   . Depression   . Mixed incontinence     Medications:  Anti-infectives    Start     Dose/Rate Route Frequency Ordered Stop   10/03/15 1830  piperacillin-tazobactam (ZOSYN) IVPB 3.375 g     3.375 g 100 mL/hr over 30 Minutes Intravenous  Once 10/03/15 1824 10/03/15 1908   10/03/15 1830  vancomycin (VANCOCIN) IVPB 1000 mg/200 mL premix     1,000 mg 200 mL/hr over 60 Minutes Intravenous  Once 10/03/15 1824 10/03/15 1947     Assessment: Patient is a 79yo female admitted for SIRS from ALF. Received Zosyn 3.375g IV and Vancomycin 1g IV in the ED. Both to be continued.  Vancomycin Pharmacokinetic parameters: Ke=0.032 T1/2=21.7 hr Vd=49L  Goal of Therapy:  Vancomycin trough level 15-20 mcg/ml  Plan:  Measure antibiotic drug levels at steady  state Follow up culture results Will order Vancomycin 1g IV q24h to start 14 hr after initial dose for stacked dosing. Will check a trough level prior to 5th dose.  Will order Zosyn 3.375g IV q8h EI.  Clovia CuffLisa Aurelia Gras, PharmD, BCPS 10/03/2015 9:45 PM

## 2015-10-03 NOTE — ED Notes (Signed)
Critical lab value. Dr. Derrill KayGoodman notified.

## 2015-10-03 NOTE — H&P (Signed)
History and Physical    Cassandra Steele BJY:[id number] DOB: [birth year] DOA: 10/03/2015  Referring physician: Dr. Derrill Kay PCP: Lyndon Code, MD  Specialists: none  Chief Complaint: AMS/lethargy  HPI: Cassandra Steele is a 79 y.o. female has a past medical history significant for advanced dementia and recurrent UTI's from ALF now with AMS and lethargy. In the ER, pt was febrile and tachycardic with leukocytosis. UA grossly abnormal. The patient is unable to give history. She is now admitted for further evaluation. A caretaker from her ALF states that the pt is a FULL CODE. She has been more lethargic but with N/V/D.  Review of Systems: The caretaker denies  fever, weight loss,, vision loss, decreased hearing, hoarseness, chest pain, syncope, dyspnea on exertion, peripheral edema, balance deficits, hemoptysis, abdominal pain, melena, hematochezia, severe indigestion/heartburn, hematuria, incontinence, genital sores, muscle weakness, suspicious skin lesions, transient blindness, difficulty walking, depression, unusual weight change, abnormal bleeding, enlarged lymph nodes, angioedema, and breast masses.   Past Medical History  Diagnosis Date  . Hypertension   . Dementia   . Alzheimer's disease   . Thyroid disease   . Anemia   . Osteoarthritis   . Depression   . Mixed incontinence    History reviewed. No pertinent past surgical history. Social History:  reports that she has never smoked. She does not have any smokeless tobacco history on file. She reports that she does not drink alcohol. Her drug history is not on file.  No Known Allergies  Family History  Problem Relation Age of Onset  . Family history unknown: Yes    Prior to Admission medications   Medication Sig Start Date End Date Taking? Authorizing Provider  acetaminophen (TYLENOL) 325 MG tablet Take 650 mg by mouth 2 (two) times daily.    Historical Provider, MD  amoxicillin (AMOXIL) 500 MG tablet Take 1 tablet (500 mg total) by  mouth 3 (three) times daily. 04/21/15   Milagros Loll, MD  aspirin 81 MG chewable tablet Chew 81 mg by mouth daily.    Historical Provider, MD  Calcium Carbonate-Vitamin D 600-400 MG-UNIT per tablet Take 1 tablet by mouth 2 (two) times daily.    Historical Provider, MD  citalopram (CELEXA) 40 MG tablet Take 40 mg by mouth at bedtime. 04/16/15   Historical Provider, MD  diclofenac sodium (VOLTAREN) 1 % GEL Apply 1 application topically 2 (two) times daily as needed (Apply to affected hips and knees twice a day as needed for joint pains.).  04/15/15   Historical Provider, MD  haloperidol (HALDOL) 0.5 MG tablet Take 0.5 mg by mouth See admin instructions. Take 1 tablet orally once a day and Take 1 tablet orally every 6 hours as needed for agitation. 04/15/15   Historical Provider, MD  hydrocortisone (ANUSOL-HC) 2.5 % rectal cream Place 1 application rectally daily as needed for hemorrhoids or itching.    Historical Provider, MD  hydrocortisone 2.5 % cream Apply 1 application topically 2 (two) times daily as needed. For rash. 04/15/15   Historical Provider, MD  hydrOXYzine (ATARAX/VISTARIL) 25 MG tablet Take 25 mg by mouth every 6 (six) hours as needed for itching (or agitation.).    Historical Provider, MD  levothyroxine (SYNTHROID, LEVOTHROID) 75 MCG tablet Take 75 mcg by mouth daily. 04/02/15   Historical Provider, MD  LORazepam (ATIVAN) 0.5 MG tablet Take 0.5 mg by mouth every 8 (eight) hours as needed (for agitation.).  03/15/15   Historical Provider, MD  pravastatin (PRAVACHOL) 10 MG tablet Take 10 mg  by mouth at bedtime. 04/15/15   Historical Provider, MD  VESICARE 5 MG tablet Take 5 mg by mouth every evening. 04/15/15   Historical Provider, MD   Physical Exam: Filed Vitals:   10/03/15 1817 10/03/15 1820 10/03/15 1825 10/03/15 1900  BP:    130/77  Pulse:  120  121  Temp:  101.5 F (38.6 C)  101.7 F (38.7 C)  TempSrc:  Rectal Rectal   Resp:  22  32  Height: 5\' 4"  (1.626 m)     Weight: 69.854 kg (154 lb)      SpO2:    99%     General:  Chronically ill but in NAD  Eyes: PERRL, EOMI, no scleral icterus  ENT: dry oropharynx  Neck: supple, no lymphadenopathy  Cardiovascular: rapid rate without MRG; 2+ peripheral pulses, no JVD, trace peripheral edema  Respiratory: basilar rhonchi without wheezes or rales  Abdomen: soft, non tender to palpation, positive bowel sounds, no guarding, no rebound  Skin: no rashes  Musculoskeletal: normal bulk and tone, no joint swelling  Psychiatric: confused, lethargic  Neurologic: CN 2-12 grossly intact, MS 5/5 in all 4  Labs on Admission:  Basic Metabolic Panel:  Recent Labs Lab 10/03/15 1835  NA 138  K 3.9  CL 103  CO2 24  GLUCOSE 131*  BUN 28*  CREATININE 1.37*  CALCIUM 10.2   Liver Function Tests:  Recent Labs Lab 10/03/15 1835  AST 114*  ALT 44  ALKPHOS 54  BILITOT 0.7  PROT 8.8*  ALBUMIN 3.9   No results for input(s): LIPASE, AMYLASE in the last 168 hours. No results for input(s): AMMONIA in the last 168 hours. CBC:  Recent Labs Lab 10/03/15 1835  WBC 11.3*  NEUTROABS 9.9*  HGB 12.7  HCT 37.1  MCV 90.5  PLT 151   Cardiac Enzymes:  Recent Labs Lab 10/03/15 1835  TROPONINI <0.03    BNP (last 3 results) No results for input(s): BNP in the last 8760 hours.  ProBNP (last 3 results) No results for input(s): PROBNP in the last 8760 hours.  CBG: No results for input(s): GLUCAP in the last 168 hours.  Radiological Exams on Admission: Dg Chest Port 1 View  10/03/2015  CLINICAL DATA:  Sepsis.  Decreased responsiveness. EXAM: PORTABLE CHEST - 1 VIEW COMPARISON:  One-view chest x-ray 04/18/2015 FINDINGS: The lung volumes remain low, exaggerating the heart size and pulmonary interstitium. No focal airspace disease is present. There is no significant edema or effusion. The visualized soft tissues and bony thorax are unremarkable. IMPRESSION: Low lung volumes. No acute cardiopulmonary disease. Electronically Signed    By: Marin Robertshristopher  Mattern M.D.   On: 10/03/2015 19:22    EKG: Independently reviewed.  Assessment/Plan Principal Problem:   SIRS (systemic inflammatory response syndrome) (HCC) Active Problems:   Recurrent UTI   Will admit to floor as FULL CODE and send cultures. Begin IV ABX and IV fluids. Consult PT and CSW. Repeat labs in AM. Prognosis poor.  Diet: NPO except meds Fluids: NS@100  DVT Prophylaxis: SQ Heparin  Code Status: FULL  Family Communication: none  Disposition Plan: SNF  Time spent: 50 min

## 2015-10-03 NOTE — ED Notes (Addendum)
Per EMS: From [facility].  reported less responsive than normal at 1500. whether this was discovered at 1500 or sudden onset at 1500, this is unclear..  currently being treated for uti, for 3 days now.  oral temp with ems yielded 98.8.  118/70, tachypnea 22 bpm clear to auscultation 96% or greater on room air and tachy 120 in sinus rhythm.  withdraws from pain very well, unsucessful IV en route.  normally walking and talking, however is usually disoriented.   on cipro and bactrim

## 2015-10-04 LAB — CBC
HEMATOCRIT: 29 % — AB (ref 35.0–47.0)
Hemoglobin: 10 g/dL — ABNORMAL LOW (ref 12.0–16.0)
MCH: 31.2 pg (ref 26.0–34.0)
MCHC: 34.5 g/dL (ref 32.0–36.0)
MCV: 90.4 fL (ref 80.0–100.0)
PLATELETS: 104 10*3/uL — AB (ref 150–440)
RBC: 3.2 MIL/uL — ABNORMAL LOW (ref 3.80–5.20)
RDW: 13.5 % (ref 11.5–14.5)
WBC: 5.5 10*3/uL (ref 3.6–11.0)

## 2015-10-04 LAB — COMPREHENSIVE METABOLIC PANEL
ALT: 41 U/L (ref 14–54)
ANION GAP: 5 (ref 5–15)
AST: 104 U/L — AB (ref 15–41)
Albumin: 2.7 g/dL — ABNORMAL LOW (ref 3.5–5.0)
Alkaline Phosphatase: 36 U/L — ABNORMAL LOW (ref 38–126)
BILIRUBIN TOTAL: 0.6 mg/dL (ref 0.3–1.2)
BUN: 22 mg/dL — AB (ref 6–20)
CO2: 22 mmol/L (ref 22–32)
Calcium: 8.2 mg/dL — ABNORMAL LOW (ref 8.9–10.3)
Chloride: 111 mmol/L (ref 101–111)
Creatinine, Ser: 1.05 mg/dL — ABNORMAL HIGH (ref 0.44–1.00)
GFR, EST AFRICAN AMERICAN: 58 mL/min — AB (ref >60)
GFR, EST NON AFRICAN AMERICAN: 50 mL/min — AB (ref >60)
Glucose, Bld: 100 mg/dL — ABNORMAL HIGH (ref 65–99)
POTASSIUM: 3.7 mmol/L (ref 3.5–5.1)
Sodium: 138 mmol/L (ref 135–145)
TOTAL PROTEIN: 5.9 g/dL — AB (ref 6.5–8.1)

## 2015-10-04 LAB — MRSA PCR SCREENING: MRSA by PCR: NEGATIVE

## 2015-10-04 MED ORDER — ENSURE ENLIVE PO LIQD
237.0000 mL | Freq: Two times a day (BID) | ORAL | Status: DC
  Administered 2015-10-06: 237 mL via ORAL

## 2015-10-04 NOTE — Evaluation (Signed)
Clinical/Bedside Swallow Evaluation Patient Details  Name: Cassandra Steele MRN: [id number] Date of Birth: [birth year]  Today's Date: 10/04/2015 Time: SLP Start Time (ACUTE ONLY): 0915 SLP Stop Time (ACUTE ONLY): 1003 SLP Time Calculation (min) (ACUTE ONLY): 48 min  Past Medical History:  Past Medical History  Diagnosis Date  . Hypertension   . Dementia   . Alzheimer's disease   . Thyroid disease   . Anemia   . Osteoarthritis   . Depression   . Mixed incontinence    Past Surgical History: History reviewed. No pertinent past surgical history. HPI:  Pt is a 79 y.o. female has a past medical history significant for advanced Dementia, HTN, and recurrent UTI's from ALF now with AMS and lethargy. In the ER, pt was febrile and tachycardic with leukocytosis. UA grossly abnormal. The patient is unable to give history. She is now admitted for further evaluation. A caretaker from her ALF states that the pt is a FULL CODE. She has been more lethargic but with N/V/D. Pt presents w/ confusion w/ mumbled speech and picking at her covers/leads. Pt gave a few, intermittent spontaneous clear words when she drank the water. She did not follow any instructions.    Assessment / Plan / Recommendation Clinical Impression  Pt appeared to adequately tolerate trials of thin liquids and purees w/ no overt s/s of aspiration noted; no coughing or throat clearing noted. During the oral phase, pt exhibited decreased awareness of taking the boluses from the spoon and required verbal, some tactile, cues to encourage boluses(spoon, straw to lips at times). Once the boluses were in the mouth, no oropharyngeal phase deficits were noted. Pt appears to need careful feeding and encouragement to take foods/liquids sec. to her declined Cognitive status(baseline). Dementia can impact the oral phase of swallowing and overall awareness of taking po's. She required full feeding assistance. Rec. a Dys. 1 diet w/ thin liquids; straws ok;  aspiration precautions; meds in Puree - Crushed as able.     Aspiration Risk  Mild aspiration risk (sec. to declined Cognitive status and need for feeding A)    Diet Recommendation  Dys. 1 w/ thin liquids; aspiration precautions; feeding assistance at all meals; drink supplement per Dietician  Medication Administration: Crushed with puree    Other  Recommendations Recommended Consults:  (Dietician - consult placed for drink supplement) Oral Care Recommendations: Oral care BID;Staff/trained caregiver to provide oral care   Follow up Recommendations   (TBD)    Frequency and Duration min 2x/week  1 week       Swallow Study   General Date of Onset: 10/03/15 HPI: Pt is a 79 y.o. female has a past medical history significant for advanced Dementia, HTN, and recurrent UTI's from ALF now with AMS and lethargy. In the ER, pt was febrile and tachycardic with leukocytosis. UA grossly abnormal. The patient is unable to give history. She is now admitted for further evaluation. A caretaker from her ALF states that the pt is a FULL CODE. She has been more lethargic but with N/V/D. Pt presents w/ confusion w/ mumbled speech and picking at her covers/leads. Pt gave a few, intermittent spontaneous clear words when she drank the water. She did not follow any instructions.  Type of Study: Bedside Swallow Evaluation Previous Swallow Assessment: none indicated Diet Prior to this Study: Thin liquids;Regular (unknown?) Temperature Spikes Noted: Yes (101.7 at admission;  WBC 11.3 at admission, 5.5 today) Respiratory Status: Room air History of Recent Intubation: No Behavior/Cognition: Cooperative;Confused;Distractible;Requires cueing;Doesn't follow  directions (awake; mumbled speech) Oral Cavity Assessment:  (could not fully assess sec. to Cognitive status) Oral Care Completed by SLP: Yes Oral Cavity - Dentition: Missing dentition (some dentition) Vision:  (did not feed self d/t confusion) Self-Feeding  Abilities: Total assist (Cognitive status) Patient Positioning: Upright in bed Baseline Vocal Quality: Low vocal intensity (mumbled speech) Volitional Cough: Cognitively unable to elicit Volitional Swallow: Unable to elicit    Oral/Motor/Sensory Function Overall Oral Motor/Sensory Function:  (did not appear to have any outward weakness)   Ice Chips Ice chips: Impaired Presentation: Spoon (fed; 5 trials) Oral Phase Impairments: Poor awareness of bolus (taking bolus into mouth) Oral Phase Functional Implications:  (none) Pharyngeal Phase Impairments:  (none)   Thin Liquid Thin Liquid: Impaired Presentation: Cup;Straw (fed; ~6 ozs total) Oral Phase Impairments: Poor awareness of bolus (taking boluses into mouth; awareness of straw) Oral Phase Functional Implications:  (none) Pharyngeal  Phase Impairments:  (none)    Nectar Thick Nectar Thick Liquid: Not tested   Honey Thick Honey Thick Liquid: Not tested   Puree Puree: Impaired Presentation: Spoon (fed; 10 trials) Oral Phase Impairments: Poor awareness of bolus (taking bolus into mouth) Oral Phase Functional Implications: Prolonged oral transit (intermittently) Pharyngeal Phase Impairments:  (none)   Solid Solid: Not tested       Jerilynn SomKatherine Watson, MS, CCC-SLP  Watson,Katherine 10/04/2015,10:04 AM

## 2015-10-04 NOTE — Progress Notes (Signed)
Unable to do pt's profile due to pt's mental status. No family available

## 2015-10-04 NOTE — NC FL2 (Signed)
[place] MEDICAID FL2 LEVEL OF CARE SCREENING TOOL     IDENTIFICATION  Patient Name: Cassandra Steele Birthdate: Aug 03, 1936 Sex: female Admission Date (Current Location): 10/03/2015  [facility] and [place] Number:  [facility] )  ([id number]) Facility and Address:  [facility], [address] [place], [place] [address]      Provider Number: [id number]  Attending Physician Name and Address:  Ramonita Lab, MD  Relative Name and Phone Number:       Current Level of Care: Hospital Recommended Level of Care: Assisted Living Facility Prior Approval Number:    Date Approved/Denied:   PASRR Number:  [id number])  Discharge Plan: Domiciliary (Rest home)    Current Diagnoses: Alzheimer's disease  Patient Active Problem List   Diagnosis Date Noted  . SIRS (systemic inflammatory response syndrome) (HCC) 10/03/2015  . Recurrent UTI 10/03/2015  . Sepsis (HCC) 04/18/2015  . Acute UTI 04/18/2015  . Delirium superimposed on dementia 04/18/2015  . HTN, goal below 140/90 04/18/2015    Orientation ACTIVITIES/SOCIAL BLADDER RESPIRATION    Self, Time  Passive Incontinent, Indwelling catheter Normal  BEHAVIORAL SYMPTOMS/MOOD NEUROLOGICAL BOWEL NUTRITION STATUS   (none )  (none ) Continent Diet (DYS 1)  PHYSICIAN VISITS COMMUNICATION OF NEEDS Height & Weight Skin  30 days Verbally  (162.6 cm) 148 lbs. Normal          AMBULATORY STATUS RESPIRATION    Supervision limited Normal      Personal Care Assistance Level of Assistance  Bathing, Feeding, Dressing Bathing Assistance: Limited assistance Feeding assistance: Limited assistance Dressing Assistance: Limited assistance      Functional Limitations Info  Sight, Hearing, Speech Sight Info: Adequate Hearing Info: Adequate Speech Info: Adequate       SPECIAL CARE FACTORS FREQUENCY                      Additional Factors Info  Code Status Code Status Info:  (Full Code. )              Current Medications (10/04/2015): Current Facility-Administered Medications  Medication Dose Route Frequency Provider Last Rate Last Dose  . 0.9 %  sodium chloride infusion   Intravenous Continuous Marguarite Arbour, MD 100 mL/hr at 10/04/15 0830    . acetaminophen (TYLENOL) tablet 650 mg  650 mg Oral Q6H PRN Marguarite Arbour, MD       Or  . acetaminophen (TYLENOL) suppository 650 mg  650 mg Rectal Q6H PRN Marguarite Arbour, MD      . aspirin chewable tablet 81 mg  81 mg Oral Daily Marguarite Arbour, MD      . bisacodyl (DULCOLAX) suppository 10 mg  10 mg Rectal Daily PRN Marguarite Arbour, MD      . citalopram (CELEXA) tablet 40 mg  40 mg Oral QHS Marguarite Arbour, MD      . darifenacin (ENABLEX) 24 hr tablet 7.5 mg  7.5 mg Oral Daily Marguarite Arbour, MD      . docusate sodium (COLACE) capsule 100 mg  100 mg Oral BID Marguarite Arbour, MD      . haloperidol lactate (HALDOL) injection 1 mg  1 mg Intravenous Q6H PRN Marguarite Arbour, MD      . heparin injection 5,000 Units  5,000 Units Subcutaneous 3 times per day Marguarite Arbour, MD      . hydrOXYzine (ATARAX/VISTARIL) tablet 25 mg  25 mg Oral Q6H PRN Marguarite Arbour, MD      .  levothyroxine (SYNTHROID, LEVOTHROID) tablet 75 mcg  75 mcg Oral QAC breakfast Marguarite Arbour, MD      . LORazepam (ATIVAN) tablet 0.5 mg  0.5 mg Oral Q8H PRN Marguarite Arbour, MD      . morphine 2 MG/ML injection 2 mg  2 mg Intravenous Q2H PRN Marguarite Arbour, MD      . ondansetron [facility]) tablet 4 mg  4 mg Oral Q6H PRN Marguarite Arbour, MD       Or  . ondansetron [facility]) injection 4 mg  4 mg Intravenous Q6H PRN Marguarite Arbour, MD      . piperacillin-tazobactam (ZOSYN) IVPB 3.375 g  3.375 g Intravenous 3 times per day Marguarite Arbour, MD      . pravastatin (PRAVACHOL) tablet 10 mg  10 mg Oral QHS Marguarite Arbour, MD      . vancomycin (VANCOCIN) IVPB 1000 mg/200 mL premix  1,000 mg Intravenous Q24H Marguarite Arbour, MD   1,000 mg at 10/04/15 0831    Do not use this list as official medication orders. Please verify with discharge summary.  Discharge Medications:   Medication List    ASK your doctor about these medications        acetaminophen 325 MG tablet  Commonly known as:  TYLENOL  Take 650 mg by mouth 2 (two) times daily as needed for mild pain or moderate pain.     aspirin 81 MG chewable tablet  Chew 81 mg by mouth daily.     Calcium Carbonate-Vitamin D 600-400 MG-UNIT tablet  Take 1 tablet by mouth 2 (two) times daily.     ciprofloxacin 500 MG tablet  Commonly known as:  CIPRO  Take 500 mg by mouth 2 (two) times daily.     citalopram 40 MG tablet  Commonly known as:  CELEXA  Take 40 mg by mouth at bedtime.     diclofenac sodium 1 % Gel  Commonly known as:  VOLTAREN  Apply 1 application topically 2 (two) times daily as needed (Apply to affected hips and knees twice a day as needed for joint pains.).     haloperidol 0.5 MG tablet  Commonly known as:  HALDOL  Take 0.5 mg by mouth See admin instructions. Take 1 tablet orally once a day and Take 1 tablet orally every 6 hours as needed for agitation.     hydrocortisone 2.5 % cream  Apply 1 application topically 2 (two) times daily as needed. For rash.     hydrOXYzine 25 MG tablet  Commonly known as:  ATARAX/VISTARIL  Take 25 mg by mouth every 6 (six) hours as needed for itching (or agitation.).     levothyroxine 75 MCG tablet  Commonly known as:  SYNTHROID, LEVOTHROID  Take 75 mcg by mouth daily.     LORazepam 0.5 MG tablet  Commonly known as:  ATIVAN  Take 0.5 mg by mouth every 8 (eight) hours as needed (for agitation.).     pravastatin 10 MG tablet  Commonly known as:  PRAVACHOL  Take 10 mg by mouth at bedtime.     sulfamethoxazole-trimethoprim 800-160 MG tablet  Commonly known as:  BACTRIM DS,SEPTRA DS  Take 1 tablet by mouth 2 (two) times daily. Scheduled medication.     VESICARE 5 MG tablet  Generic drug:  solifenacin  Take 5 mg by mouth at  bedtime.        Relevant Imaging Results:  Relevant Lab Results:  Recent Labs  Additional Information  SSN: [id number]  Haig ProphetMorgan, Artist Bloom G, LCSW

## 2015-10-04 NOTE — Progress Notes (Signed)
Initial Nutrition Assessment   INTERVENTION:   Meals and Snacks: Cater to patient preferences; SLP following Medical Food Supplement Therapy: will recommend Ensure Enlive po TID, each supplement provides 350 kcal and 20 grams of protein and will send Magic Cup lunch and dinner.    NUTRITION DIAGNOSIS:   Swallowing difficulty related to dysphagia as evidenced by  (current diet order, SLP following).  GOAL:   Patient will meet greater than or equal to 90% of their needs  MONITOR:    (Energy Intake, Digestive system, Anthropometrics)  REASON FOR ASSESSMENT:   Consult Poor PO  ASSESSMENT:   Pt admitted with lethargy and n/v/d secondary to UTI with h/o advanced dementia.  Past Medical History  Diagnosis Date  . Hypertension   . Dementia   . Alzheimer's disease   . Thyroid disease   . Anemia   . Osteoarthritis   . Depression   . Mixed incontinence     Diet Order:  DIET - DYS 1 Room service appropriate?: No; Fluid consistency:: Thin    Current Nutrition: Pt ate bites of mashed potatoes and vanilla pudding for lunch today per CNA. RN Caryl AspKellie reports pt ate breakfast late this am s/p SLP evaluation, roughly eating 50% of meal, therefore pt did not eat much lunch as it was not soon after. Per RN Caryl AspKellie, pt a feeder at this time.  Food/Nutrition-Related History: Unable to clarify with pt as pt confused/drowsy on visit. RD notes pt with n/v/d PTA.   Scheduled Medications:  . aspirin  81 mg Oral Daily  . citalopram  40 mg Oral QHS  . darifenacin  7.5 mg Oral Daily  . docusate sodium  100 mg Oral BID  . feeding supplement (ENSURE ENLIVE)  237 mL Oral BID AC  . heparin  5,000 Units Subcutaneous 3 times per day  . levothyroxine  75 mcg Oral QAC breakfast  . piperacillin-tazobactam (ZOSYN)  IV  3.375 g Intravenous 3 times per day  . pravastatin  10 mg Oral QHS  . vancomycin  1,000 mg Intravenous Q24H    Continuous Medications:  . sodium chloride 100 mL/hr at 10/04/15  0830     Electrolyte/Renal Profile and Glucose Profile:   Recent Labs Lab 10/03/15 1835 10/04/15 0320  NA 138 138  K 3.9 3.7  CL 103 111  CO2 24 22  BUN 28* 22*  CREATININE 1.37* 1.05*  CALCIUM 10.2 8.2*  GLUCOSE 131* 100*   Protein Profile:   Recent Labs Lab 10/03/15 1835 10/04/15 0320  ALBUMIN 3.9 2.7*    Gastrointestinal Profile: Last BM:  10/03/2015   Nutrition-Focused Physical Exam Findings: Nutrition-Focused physical exam completed. Findings are no fat depletion however unable to assess thoracic region, mild/moderate muscle depletion notably of thighs, and no edema. Unable to assess baseline mobility at this time.    Weight Change: Per [facility] pt with 4% weight loss in 5 months.   Skin:  Reviewed, no issues  Last BM:  10/03/2015  Height:   Ht Readings from Last 1 Encounters:  10/03/15 5\' 4"  (1.626 m)    Weight:   Wt Readings from Last 1 Encounters:  10/04/15 148 lb (67.132 kg)    Ideal Body Weight:     BMI:  Body mass index is 25.39 kg/(m^2).  Estimated Nutritional Needs:   Kcal:  BEE: 1145kcals, TEE: (IF 1.1-1.3)(AF 1.2) 1511-1786kcals  Protein:  54-73g protein (0.8-1.0g/kg)  Fluid:  1675-2010510mL of fluid (25-3630mL/kg)  EDUCATION NEEDS:   Education needs no appropriate at this  time   MODERATE Care Level  Leda Quail, [place], [place] Pager [phone]

## 2015-10-04 NOTE — Evaluation (Signed)
Physical Therapy Evaluation Patient Details Name: Cassandra Steele MRN: [id number] DOB: [birth year] Today's Date: 10/04/2015   History of Present Illness  Presented to ER from family care home (ALF?) secondary to lethargy and confusion; admitted for sepsis related to UTI. Pt with history of advanced Alzheimers dementia and is unable to provide and meaningful information at time of evaluation. Pt is AOx1 at time of evaluation. All history obtained from medical record  Clinical Impression  Pt is very confused during PT evaluation and does not follow therapist commands. Therapist able to get pt to upright sitting at EOB but she is unwilling to transfer or ambulation. Pt provides very minimal intelligible conversation and is AOx 1 at time of evaluation. No caregiver present to supplement history or clarify baseline cognitive status so all information obtained from medical record. Unclear if pt is really appropriate for PT given advanced alzheimer's and limited ability to participate on this date. Will keep patient on caseload as a 3 visit trial to see if she can follow commands as UTI is treated. Pending participation with therapist at [facility] will update recommendations for PT after discharge.    Follow Up Recommendations Home health PT;Supervision/Assistance - 24 hour [facility] PT? Pt will need to participate with PT at [facility])    Equipment Recommendations  None recommended by PT    Recommendations for Other Services       Precautions / Restrictions Precautions Precautions: Fall Restrictions Weight Bearing Restrictions: No      Mobility  Bed Mobility Overal bed mobility: Needs Assistance Bed Mobility: Supine to Sit;Sit to Supine     Supine to sit: Max assist Sit to supine: Max assist   General bed mobility comments: Pt with poor command follow. Does not follow therapist commands at all. Appears somewhat weak but appears more limited by cognition and participation  Transfers                 General transfer comment: Attempted to transfer with patient. Patient actively resisting therapist and will not scoot toward EOB. Not aggressive but will not participate. Unable to provide any meaningful information regarding resistance to transfers. Unable to attempt ambulation. Eventually pt returned to supine  Ambulation/Gait                Stairs            Wheelchair Mobility    Modified Rankin (Stroke Patients Only)       Balance Overall balance assessment: Needs assistance Sitting-balance support: No upper extremity supported;Feet unsupported Sitting balance-Leahy Scale: Fair Sitting balance - Comments: Requires increased time to maintain sitting balance but eventually able to remain upright independently                                     Pertinent Vitals/Pain Pain Assessment: Faces Faces Pain Scale: No hurt    Home Living Family/patient expects to be discharged to:: Unsure                 Additional Comments: From [facility] - family care home per medical record    Prior Function Level of Independence: Needs assistance         Comments: Per facility representative during last admission, patient was able to ambulate around facility with assist from staff "when she's feeling well"; regularly requires assist for all ADLs.  Requires extensive assist "when she's sick" for all self-care and mobility.  Hand Dominance        Extremity/Trunk Assessment   Upper Extremity Assessment: Difficult to assess due to impaired cognition           Lower Extremity Assessment: Difficult to assess due to impaired cognition         Communication   Communication: Other (comment) (Slurred mumbling, hard to understand)  Cognition Arousal/Alertness: Awake/alert Behavior During Therapy: Flat affect (Mildly agitated) Overall Cognitive Status: No family/caregiver present to determine baseline cognitive functioning (History of  advanced alzheimers dementia, AOx1)                      General Comments      Exercises        Assessment/Plan    PT Assessment Patient needs continued PT services  PT Diagnosis Difficulty walking;Generalized weakness;Altered mental status   PT Problem List Decreased strength;Decreased activity tolerance;Decreased cognition  PT Treatment Interventions DME instruction;Gait training;Functional mobility training;Therapeutic activities;Therapeutic exercise;Balance training;Neuromuscular re-education;Cognitive remediation;Wheelchair mobility training   PT Goals (Current goals can be found in the Care Plan section)      Frequency Other (Comment) (Trial x 3 visits to see if patient can participate)   Barriers to discharge        Co-evaluation               End of Session   Activity Tolerance: Other (comment) (Limited by cognition) Patient left: in bed;with bed alarm set;with call bell/phone within reach Nurse Communication: Mobility status         Time: [id number] PT Time Calculation (min) (ACUTE ONLY): 15 min   Charges:   PT Evaluation $Initial PT Evaluation Tier I: 1 Procedure     PT G Codes:       Sharalyn InkJason D Prosper Paff PT, DPT   Lakina Mcintire 10/04/2015, 10:48 AM

## 2015-10-04 NOTE — Progress Notes (Signed)
[facility] [place] [facility]   PATIENT NAME: Cassandra Steele    MR#:  [id number]  DATE OF BIRTH:  02/15/1936  SUBJECTIVE:  CHIEF COMPLAINT: pt is demented. Poor po intake, no family bedside  REVIEW OF SYSTEMS:  Unobtainable from dementia DRUG ALLERGIES:  No Known Allergies  VITALS:  Blood pressure 100/60, pulse 100, temperature 100.5 F (38.1 C), temperature source Axillary, resp. rate 18, height 5\' 4"  (1.626 m), weight 67.132 kg (148 lb), SpO2 100 %.  PHYSICAL EXAMINATION:  GENERAL:  79 y.o.-year-old patient lying in the bed with lethargy EYES: Pupils equal, round, reactive to light and accommodation. No scleral icterus.  HEENT: Head atraumatic, normocephalic. Oropharynx and nasopharynx clear.  NECK:  Supple, no jugular venous distention. No thyroid enlargement, no tenderness.  LUNGS: Normal breath sounds bilaterally, no wheezing, rales,rhonchi or crepitation. No use of accessory muscles of respiration.  CARDIOVASCULAR: S1, S2 normal. No murmurs, rubs, or gallops.  ABDOMEN: Soft, nontender, nondistended. Bowel sounds present. No organomegaly or mass.  EXTREMITIES: No pedal edema, cyanosis, or clubbing.  NEUROLOGIC: lethargic  PSYCHIATRIC: The patient is lethargic SKIN: No obvious rash, lesion, or ulcer.    LABORATORY PANEL:   CBC  Recent Labs Lab 10/04/15 0320  WBC 5.5  HGB 10.0*  HCT 29.0*  PLT 104*   ------------------------------------------------------------------------------------------------------------------  Chemistries   Recent Labs Lab 10/04/15 0320  NA 138  K 3.7  CL 111  CO2 22  GLUCOSE 100*  BUN 22*  CREATININE 1.05*  CALCIUM 8.2*  AST 104*  ALT 41  ALKPHOS 36*  BILITOT 0.6   ------------------------------------------------------------------------------------------------------------------  Cardiac Enzymes  Recent Labs Lab 10/03/15 1835  TROPONINI <0.03    ------------------------------------------------------------------------------------------------------------------  RADIOLOGY:  Dg Chest Port 1 View  10/03/2015  CLINICAL DATA:  Sepsis.  Decreased responsiveness. EXAM: PORTABLE CHEST - 1 VIEW COMPARISON:  One-view chest x-ray 04/18/2015 FINDINGS: The lung volumes remain low, exaggerating the heart size and pulmonary interstitium. No focal airspace disease is present. There is no significant edema or effusion. The visualized soft tissues and bony thorax are unremarkable. IMPRESSION: Low lung volumes. No acute cardiopulmonary disease. Electronically Signed   By: Marin Robertshristopher  Mattern M.D.   On: 10/03/2015 19:22    EKG:   Orders placed or performed during the hospital encounter of 04/18/15  . ED EKG  . ED EKG    ASSESSMENT AND PLAN:   1. AMS with underlying demntia 2/2 a cystitis ivf Iv abx - zosyn and vanc  2. A cystitis  F/u urine cx and continue abx  3. ch dementia   4. Poor po intake 2/2 #1 S/p ST eval, couldn't perform 2/2 ams Consult dietician Consult palliative care         All the records are reviewed and case discussed with Care Management/Social Workerr. Management plans discussed with the patient, family and they are in agreement.  CODE STATUS: FC  TOTAL TIME TAKING CARE OF THIS PATIENT: 35 minutes.   POSSIBLE D/C IN 2 DAYS, DEPENDING ON CLINICAL CONDITION.   Ramonita LabGouru, Leeba Barbe M.D on 10/04/2015 at 6:34 PM  Between 7am to 6pm - Pager - [phone] After 6pm go to [email or web address] - password [email or web address] [facility]  [facility]  Office  [phone]  CC: Primary care physician; Lyndon CodeKHAN, FOZIA M, MD

## 2015-10-04 NOTE — Clinical Social Work Note (Signed)
Clinical Social Work Assessment  Patient Details  Name: Cassandra LoganFrances Steele MRN: [id number] Date of Birth: 08/07/1936  Date of referral:  10/04/15               Reason for consult:  Facility Placement, Other (Comment Required) (From [facility] )                Permission sought to share information with:  Facility Medical sales representativeContact Representative Permission granted to share information::     Name::      [facility]  Agency::     Relationship::     Contact Information:     Housing/Transportation Living arrangements for the past 2 months:  [facility] Source of Information:  Facility Patient Interpreter Needed:  None Criminal Activity/Legal Involvement Pertinent to Current Situation/Hospitalization:  No - Comment as needed Significant Relationships:  Other Family Members, Siblings Lives with:  Facility Resident Do you feel safe going back to the place where you live?    Need for family participation in patient care:  Yes (Comment)  Care giving concerns:  [facility]    Social Worker assessment / plan: Visual merchandiserClinical Social Worker (CSW) received consult for placement. Per ED notes patient came from [facility]. Per RN patient is not alert and oriented. [facility]. Per Liborio NixonJanice patient is total care at ALF. Staff assist with feeding, bathing, and dressing. Per Liborio NixonJanice patient has a visual impairment and advanced dementia. Liborio NixonJanice reported that patient has been at [facility] since August 2016. Patient came to [facility] from a smaller family care home in which she lived for 4 years, until they could not longer "handle her due to dementia." Per Liborio NixonJanice patient walks some days and other days refuses to walk. Per Liborio NixonJanice patient does not have a POA however she has an elderly sister and niece that call to check on her. Liborio NixonJanice reported that patient can return home to [facility]. Liborio NixonJanice requested a palliative care consult and is  interested in patient returning with hospice. Per Liborio NixonJanice patient has been declining.   FL2 complete and palliative consult in place. CSW will continue to follow and assist as needed.   Employment status:  Disabled (Comment on whether or not currently receiving Disability), Retired Health and safety inspectornsurance information:  Armed forces operational officerMedicare, [place] Recommendations:  Home with Home Health Information / Referral to community resources:  Other (Comment Required) (Assisted Living Facility )  Patient/Family's Response to care: Per Liborio NixonJanice patient can return to [facility].    Patient/Family's Understanding of and Emotional Response to Diagnosis, Current Treatment, and Prognosis:  Liborio NixonJanice was pleasant and reported that she would visit patient tomorrow morning.   Emotional Assessment Appearance:  Appears stated age Attitude/Demeanor/Rapport:  Unable to Assess Affect (typically observed):  Unable to Assess Orientation:  Oriented to Self, Fluctuating Orientation (Suspected and/or reported Sundowners) Alcohol / Substance use:  Not Applicable Psych involvement (Current and /or in the community):  No (Comment)  Discharge Needs  Concerns to be addressed:  Discharge Planning Concerns Readmission within the last 30 days:  No Current discharge risk:  Chronically ill, Cognitively Impaired Barriers to Discharge:  Continued Medical Work up   Mirian MoMorgan, Cailie Bosshart G, LCSW 10/04/2015, 4:51 PM

## 2015-10-05 DIAGNOSIS — R630 Anorexia: Secondary | ICD-10-CM

## 2015-10-05 DIAGNOSIS — R1310 Dysphagia, unspecified: Secondary | ICD-10-CM

## 2015-10-05 DIAGNOSIS — Z515 Encounter for palliative care: Secondary | ICD-10-CM

## 2015-10-05 DIAGNOSIS — G9341 Metabolic encephalopathy: Secondary | ICD-10-CM

## 2015-10-05 DIAGNOSIS — N390 Urinary tract infection, site not specified: Secondary | ICD-10-CM

## 2015-10-05 DIAGNOSIS — F0390 Unspecified dementia without behavioral disturbance: Secondary | ICD-10-CM

## 2015-10-05 DIAGNOSIS — D696 Thrombocytopenia, unspecified: Secondary | ICD-10-CM

## 2015-10-05 DIAGNOSIS — D649 Anemia, unspecified: Secondary | ICD-10-CM

## 2015-10-05 DIAGNOSIS — R4182 Altered mental status, unspecified: Secondary | ICD-10-CM

## 2015-10-05 MED ORDER — KCL IN DEXTROSE-NACL 20-5-0.9 MEQ/L-%-% IV SOLN
INTRAVENOUS | Status: AC
  Administered 2015-10-05: 09:00:00 via INTRAVENOUS
  Filled 2015-10-05 (×3): qty 1000

## 2015-10-05 MED ORDER — SODIUM CHLORIDE 0.9 % IV BOLUS (SEPSIS)
1000.0000 mL | Freq: Once | INTRAVENOUS | Status: AC
  Administered 2015-10-05: 1000 mL via INTRAVENOUS

## 2015-10-05 NOTE — Progress Notes (Signed)
Palliative Care update- [name] to follow-up on palliative care consult. Per Liborio NixonJanice, she would like to be present at the hospital for this discussion, however is unable to visit today due to obligations at one of her facilities. Informed Liborio NixonJanice that an updated contact number was needed for patient's sister to be able to discuss patient's goals of care and upcoming discharge plan. Liborio NixonJanice states she will get contact information and return call to Clinical research associatewriter with a time she is able to meet. Writer contacted the facility and received updated contact information for patient's sister [name] [phone]. Telephone call to [name] who did not answer and whose voice mailbox is full, unable to leave message. Will re-attempt to contact [name] to discuss patient's code status, goals of care, and possible [facility]. Care team updated. Will continue to follow.   Guilford ShiEmily Howell, MSW, LCSW Palliative Care social worker [phone] (c)

## 2015-10-05 NOTE — Care Management Important Message (Signed)
Important Message  Patient Details  Name: Cassandra Steele MRN: [id number] Date of Birth: 01/10/1936   Medicare Important Message Given:  Yes    Olegario MessierKathy A Bjorn Hallas 10/05/2015, 11:02 AM

## 2015-10-05 NOTE — Progress Notes (Signed)
Pt

## 2015-10-05 NOTE — Progress Notes (Signed)
Received order to discontinue telemetry

## 2015-10-05 NOTE — ED Provider Notes (Signed)
[facility] Emergency Department Provider Note   ____________________________________________  Time seen: On EMS arrival  I have reviewed the triage vital signs and the nursing notes.   HISTORY  Chief Complaint Code Sepsis   History limited by: Altered Mental Status   HPI Cassandra Steele is a 79 y.o. female who presented from assisted living facility today because of altered mental status. It appears this was noticed a couple hours prior to arrival. The patient herself is unable to give any history. The patient is currently being treated for a urinary tract infection. Per report this is been for 3 days. EMS showed that patient was tachycardic.   Past Medical History  Diagnosis Date  . Hypertension   . Dementia   . Alzheimer's disease   . Thyroid disease   . Anemia   . Osteoarthritis   . Depression   . Mixed incontinence     Patient Active Problem List   Diagnosis Date Noted  . SIRS (systemic inflammatory response syndrome) (HCC) 10/03/2015  . Recurrent UTI 10/03/2015  . Sepsis (HCC) 04/18/2015  . Acute UTI 04/18/2015  . Delirium superimposed on dementia 04/18/2015  . HTN, goal below 140/90 04/18/2015    History reviewed. No pertinent past surgical history.  No current outpatient prescriptions on file.  Allergies Review of patient's allergies indicates no known allergies.  Family History  Problem Relation Age of Onset  . Family history unknown: Yes    Social History Social History  Substance Use Topics  . Smoking status: Never Smoker   . Smokeless tobacco: None  . Alcohol Use: No    Review of Systems Unable to obtain secondary to altered mental status.  ____________________________________________   PHYSICAL EXAM:  VITAL SIGNS: ED Triage Vitals  Enc Vitals Group     BP 10/03/15 1845 107/74 mmHg     Pulse Rate 10/03/15 1820 120     Resp 10/03/15 1820 22     Temp 10/03/15 1820 101.5 F (38.6 C)     Temp Source 10/03/15  1820 Rectal     SpO2 10/03/15 1815 100 %     Weight 10/03/15 1817 154 lb (69.854 kg)     Height 10/03/15 1817 5\' 4"  (1.626 m)     Head Cir --      Peak Flow --      Pain Score 10/05/15 1116 0   Constitutional: Awake minimally responsive to verbal stimuli. Not oriented. Eyes: Conjunctivae are normal. PERRL. Normal extraocular movements. ENT   Head: Normocephalic and atraumatic.   Nose: No congestion/rhinnorhea.   Mouth/Throat: Mucous membranes are moist.   Neck: No stridor. Hematological/Lymphatic/Immunilogical: No cervical lymphadenopathy. Cardiovascular: Tachycardic, regular rhythm.  No murmurs, rubs, or gallops. Respiratory: Normal respiratory effort without tachypnea nor retractions. Breath sounds are clear and equal bilaterally. No wheezes/rales/rhonchi. Gastrointestinal: Soft and minimally tender in the lower abdomin. No rebound or guarding.  Genitourinary: Deferred Musculoskeletal: Normal range of motion in all extremities. No joint effusions.  No lower extremity tenderness nor edema. Neurologic:  Awake, not oriented. Will try to answer verbally to verbal stimuli. Skin:  Skin is warm, dry and intact. No rash noted.  ____________________________________________    LABS (pertinent positives/negatives)  UA: WBC too numerous to count Nitrite positive  Blood WBC 11.3 Lactate 5.0 Cr 1.37  ____________________________________________   EKG  None  ____________________________________________    RADIOLOGY  CXR  IMPRESSION: Low lung volumes.  No acute cardiopulmonary disease.   ____________________________________________   PROCEDURES  Procedure(s) performed: None  Critical Care performed: Yes, see critical care note(s)  CRITICAL CARE Performed by: Phineas Semen   Total critical care time: 30 minutes  Critical care time was exclusive of separately billable procedures and treating other patients.  Critical care was necessary to  treat or prevent imminent or life-threatening deterioration.  Critical care was time spent personally by me on the following activities: development of treatment plan with patient and/or surrogate as well as nursing, discussions with consultants, evaluation of patient's response to treatment, examination of patient, obtaining history from patient or surrogate, ordering and performing treatments and interventions, ordering and review of laboratory studies, ordering and review of radiographic studies, pulse oximetry and re-evaluation of patient's condition.  ____________________________________________   INITIAL IMPRESSION / ASSESSMENT AND PLAN / ED COURSE  Pertinent labs & imaging results that were available during my care of the patient were reviewed by me and considered in my medical decision making (see chart for details).  Patient presented to the emergency department via EMS because of altered mental status noticed at her living facility. The patient is unable to give any history. We do know that the patient was being treated for urinary tract infection. Patient's initial vital signs concerning for sepsis given fever tachycardia and tachypnea because of this code sepsis was called. The patient was given fluids and broad-spectrum antibiotics. Lab work came back concerning for elevated lactic take acid as well as leukocytosis. This fits with the picture of sepsis likely secondary to urinary tract infection given grossly contaminated UA.  ____________________________________________   FINAL CLINICAL IMPRESSION(S) / ED DIAGNOSES  Final diagnoses:  UTI (lower urinary tract infection)  Sepsis, due to unspecified organism [facility])     Phineas Semen, MD 10/05/15 2152

## 2015-10-05 NOTE — Progress Notes (Signed)
PT Cancellation Note  Patient Details Name: Cassandra LoganFrances Dudgeon MRN: [id number] DOB: 05/30/1936   Cancelled Treatment:    Reason Eval/Treat Not Completed: Patient's level of consciousness. Attempted to treat pt but she is completely confused at this time and does not follow commands at all. Pt not appropriate to participate with PT at this time. Will attempt another date to continue to assess if patient is appropriate. Per MD notes potential DC with Hospice services. Pt will benefit from skilled PT services to address deficits in strength, balance, and mobility in order to return to full function at home.  Sharalyn InkJason D Jaquanda Wickersham PT, DPT   Emersen Mascari 10/05/2015, 5:22 PM

## 2015-10-05 NOTE — Progress Notes (Addendum)
[facility] Physicians - [place] at [facility]   PATIENT NAME: Cassandra Steele    MR#:  [id number]  DATE OF BIRTH:  Sep 20, 1936  SUBJECTIVE:  CHIEF COMPLAINT: pt is demented, more alert today than yesterday. Poor po intake as reported by sitter at bedside, no family bedside,  REVIEW OF SYSTEMS:  Unobtainable from dementia DRUG ALLERGIES:  No Known Allergies  VITALS:  Blood pressure 104/57, pulse 73, temperature 97.3 F (36.3 C), temperature source Axillary, resp. rate 18, height  (1.626 m), weight 69.117 kg (152 lb 6 oz), SpO2 100 %.  PHYSICAL EXAMINATION:  GENERAL:  79 y.o.-year-old patient lying in the bed with lethargy EYES: Pupils equal, round, reactive to light and accommodation. No scleral icterus.  HEENT: Head atraumatic, normocephalic. Oropharynx and nasopharynx clear.  NECK:  Supple, no jugular venous distention. No thyroid enlargement, no tenderness.  LUNGS: Normal breath sounds bilaterally, no wheezing, rales,rhonchi or crepitation. No use of accessory muscles of respiration.  CARDIOVASCULAR: S1, S2 normal. No murmurs, rubs, or gallops.  ABDOMEN: Soft, nontender, nondistended. Bowel sounds present. No organomegaly or mass.  EXTREMITIES: No pedal edema, cyanosis, or clubbing.  NEUROLOGIC: lethargic  PSYCHIATRIC: The patient is lethargic SKIN: No obvious rash, lesion, or ulcer.    LABORATORY PANEL:   CBC  Recent Labs Lab 10/04/15 0320  WBC 5.5  HGB 10.0*  HCT 29.0*  PLT 104*   ------------------------------------------------------------------------------------------------------------------  Chemistries   Recent Labs Lab 10/04/15 0320  NA 138  K 3.7  CL 111  CO2 22  GLUCOSE 100*  BUN 22*  CREATININE 1.05*  CALCIUM 8.2*  AST 104*  ALT 41  ALKPHOS 36*  BILITOT 0.6   ------------------------------------------------------------------------------------------------------------------  Cardiac Enzymes  Recent Labs Lab 10/03/15 1835   TROPONINI <0.03   ------------------------------------------------------------------------------------------------------------------  RADIOLOGY:  Dg Chest Port 1 View  10/03/2015  CLINICAL DATA:  Sepsis.  Decreased responsiveness. EXAM: PORTABLE CHEST - 1 VIEW COMPARISON:  One-view chest x-ray 04/18/2015 FINDINGS: The lung volumes remain low, exaggerating the heart size and pulmonary interstitium. No focal airspace disease is present. There is no significant edema or effusion. The visualized soft tissues and bony thorax are unremarkable. IMPRESSION: Low lung volumes. No acute cardiopulmonary disease. Electronically Signed   By: Marin Roberts M.D.   On: 10/03/2015 19:22    EKG:   Orders placed or performed during the hospital encounter of 04/18/15  . ED EKG  . ED EKG    ASSESSMENT AND PLAN:   1. AMS with underlying demntia 2/2 a cystitis ivf Iv abx - zosyn and vanc Blood cultures are pending  2. A cystitis  F/u urine cx is greater than 100,000 colonies of gram-negative rods, sensitivities pending and continue abx  3. ch dementia  Haldol as needed for agitation  4. Poor po intake 2/2 #1 S/p ST eval, requesting dysphagia 1 diet. couldn't perform 2/2 ams Appreciate dietician recommendations Consult palliative care recommending to discharge patient back to [facility] with hospice         All the records are reviewed and case discussed with Care Management/Social Workerr. Management plans discussed with the patient, family and they are in agreement.  CODE STATUS: FC  TOTAL TIME TAKING CARE OF THIS PATIENT: 35 minutes.   POSSIBLE D/C IN 2 DAYS, DEPENDING ON CLINICAL CONDITION.   Ramonita Lab M.D on 10/05/2015 at 2:15 PM  Between 7am to 6pm - Pager - [phone] After 6pm go to [email or web address] - password Forensic psychologist Hospitalists  Office  [phone]  CC: Primary care physician; Lyndon CodeKHAN, FOZIA M, MD

## 2015-10-06 DIAGNOSIS — N390 Urinary tract infection, site not specified: Secondary | ICD-10-CM | POA: Insufficient documentation

## 2015-10-06 LAB — CBC
HEMATOCRIT: 28.6 % — AB (ref 35.0–47.0)
HEMOGLOBIN: 9.7 g/dL — AB (ref 12.0–16.0)
MCH: 30.4 pg (ref 26.0–34.0)
MCHC: 33.9 g/dL (ref 32.0–36.0)
MCV: 89.9 fL (ref 80.0–100.0)
Platelets: 131 10*3/uL — ABNORMAL LOW (ref 150–440)
RBC: 3.18 MIL/uL — AB (ref 3.80–5.20)
RDW: 13.7 % (ref 11.5–14.5)
WBC: 5.7 10*3/uL (ref 3.6–11.0)

## 2015-10-06 MED ORDER — NITROFURANTOIN MONOHYD MACRO 100 MG PO CAPS: 100.0000 mg | ORAL_CAPSULE | Freq: Two times a day (BID) | ORAL | Status: DC

## 2015-10-06 MED ORDER — NITROFURANTOIN MONOHYD MACRO 100 MG PO CAPS
100.0000 mg | ORAL_CAPSULE | Freq: Two times a day (BID) | ORAL | Status: DC
  Administered 2015-10-06: 100 mg via ORAL
  Filled 2015-10-06 (×2): qty 1

## 2015-10-06 NOTE — Consult Note (Signed)
Palliative Medicine Inpatient Consult Note   Name: Cassandra Steele Date: 10/06/2015 MRN: [id number]  DOB: Jul 21, 1936  Referring Physician: Enedina Finner, MD  Palliative Care consult requested for this 79 y.o. female for goals of medical therapy in patient with Altered Mental Status.   She has been a resident of [name] and presented here without pertinent information as to who in her family could be contacted for decision making.  Guilford Shi, Social Worker with [name], called the Administrator at the ALF Liborio Nixon), and she expressed a desire to be present and involved in discussions about patient's plan of care.  She provided the contact number for patient's sister, Carley Hammed [phone]), BUT when called, it was evident that the voice mailbox was full and Carley Hammed did not answer.  There is interest on the part of the administrator in having Hospice services for patient in the facility, but of course, there needs to be involvement of family in this decision making process.    PLAN: Guilford Shi with Hospice will continue to follow patient.  We will need to have more options for contacting a family member so that decisions can be discussed and addressed.  It may be possible and appropriate for pt to be admitted to Hospice, but we will have to follow patient's medical course and also make actual contact with family/ legal decision maker for pt.  There is a different contact now listed in demographics, and yet there is not information as to how this individual is related to patient. Will follow up on this.   IMPRESSION: Altered Mental Status/ Metabolic Encephalopathy --due to UTI/ cystitis Advanced Dementia Decreased appetite and poor oral intake Possible Dysphagia Anemia of unclear etiology Mild thrombocytopenia Moderate malnutrition   REVIEW OF SYSTEMS:  Patient is not able to provide ROS due to dementia  SPIRITUAL SUPPORT SYSTEM: unclear.  SOCIAL HISTORY:  reports that she  has never smoked. She does not have any smokeless tobacco history on file. She reports that she does not drink alcohol.  Little information known. She lives at [facility].    LEGAL DOCUMENTS:  None  CODE STATUS: Full code by default  PAST MEDICAL HISTORY: Past Medical History  Diagnosis Date  . Hypertension   . Dementia   . Alzheimer's disease   . Thyroid disease   . Anemia   . Osteoarthritis   . Depression   . Mixed incontinence     PAST SURGICAL HISTORY: History reviewed. No pertinent past surgical history.  ALLERGIES:  has No Known Allergies.  MEDICATIONS:  Current Facility-Administered Medications  Medication Dose Route Frequency Provider Last Rate Last Dose  . acetaminophen (TYLENOL) tablet 650 mg  650 mg Oral Q6H PRN Marguarite Arbour, MD       Or  . acetaminophen (TYLENOL) suppository 650 mg  650 mg Rectal Q6H PRN Marguarite Arbour, MD      . aspirin chewable tablet 81 mg  81 mg Oral Daily Marguarite Arbour, MD   81 mg at 10/05/15 1016  . bisacodyl (DULCOLAX) suppository 10 mg  10 mg Rectal Daily PRN Marguarite Arbour, MD      . citalopram (CELEXA) tablet 40 mg  40 mg Oral QHS Marguarite Arbour, MD   40 mg at 10/05/15 2128  . darifenacin (ENABLEX) 24 hr tablet 7.5 mg  7.5 mg Oral Daily Marguarite Arbour, MD   7.5 mg at 10/05/15 1016  . docusate sodium (COLACE) capsule 100 mg  100 mg Oral BID  Marguarite ArbourJeffrey D Sparks, MD   100 mg at 10/05/15 2128  . feeding supplement (ENSURE ENLIVE) (ENSURE ENLIVE) liquid 237 mL  237 mL Oral BID AC Aruna Gouru, MD      . haloperidol lactate (HALDOL) injection 1 mg  1 mg Intravenous Q6H PRN Marguarite ArbourJeffrey D Sparks, MD      . heparin injection 5,000 Units  5,000 Units Subcutaneous 3 times per day Marguarite ArbourJeffrey D Sparks, MD   5,000 Units at 10/06/15 [id number]  . hydrOXYzine (ATARAX/VISTARIL) tablet 25 mg  25 mg Oral Q6H PRN Marguarite ArbourJeffrey D Sparks, MD      . levothyroxine (SYNTHROID, LEVOTHROID) tablet 75 mcg  75 mcg Oral QAC breakfast Marguarite ArbourJeffrey D Sparks, MD   75 mcg at 10/05/15  1016  . LORazepam (ATIVAN) tablet 0.5 mg  0.5 mg Oral Q8H PRN Marguarite ArbourJeffrey D Sparks, MD      . morphine 2 MG/ML injection 2 mg  2 mg Intravenous Q2H PRN Marguarite ArbourJeffrey D Sparks, MD      . ondansetron [facility](ZOFRAN) tablet 4 mg  4 mg Oral Q6H PRN Marguarite ArbourJeffrey D Sparks, MD       Or  . ondansetron [facility](ZOFRAN) injection 4 mg  4 mg Intravenous Q6H PRN Marguarite ArbourJeffrey D Sparks, MD      . piperacillin-tazobactam (ZOSYN) IVPB 3.375 g  3.375 g Intravenous 3 times per day Marguarite ArbourJeffrey D Sparks, MD   3.375 g at 10/06/15 [id number]  . pravastatin (PRAVACHOL) tablet 10 mg  10 mg Oral QHS Marguarite ArbourJeffrey D Sparks, MD   10 mg at 10/05/15 2129  . vancomycin (VANCOCIN) IVPB 1000 mg/200 mL premix  1,000 mg Intravenous Q24H Marguarite ArbourJeffrey D Sparks, MD   1,000 mg at 10/05/15 1015    Vital Signs: BP 116/66 mmHg  Pulse 61  Temp(Src) 97.9 F (36.6 C) (Oral)  Resp 18  Ht 5\' 4"  (1.626 m)  Wt 68 kg (149 lb 14.6 oz)  BMI 25.72 kg/m2  SpO2 98% Filed Weights   10/04/15 0225 10/05/15 0418 10/06/15 0500  Weight: 67.132 kg (148 lb) 69.117 kg (152 lb 6 oz) 68 kg (149 lb 14.6 oz)    Estimated body mass index is 25.72 kg/(m^2) as calculated from the following:   Height as of this encounter: 5\' 4"  (1.626 m).   Weight as of this encounter: 68 kg (149 lb 14.6 oz).  PERFORMANCE STATUS (ECOG) : 4 - Bedbound  PHYSICAL EXAM: Very confused and also slightly lethargic NAD Hrt rrr no mgr Lungs cta Abd soft and NT Ext no cce Skin warm and dry  LABS: CBC:    Component Value Date/Time   WBC 5.7 10/06/2015 0528   WBC 10.9 02/28/2015 1521   HGB 9.7* 10/06/2015 0528   HGB 12.2 02/28/2015 1521   HCT 28.6* 10/06/2015 0528   HCT 36.4 02/28/2015 1521   PLT 131* 10/06/2015 0528   PLT 160 02/28/2015 1521   MCV 89.9 10/06/2015 0528   MCV 91 02/28/2015 1521   NEUTROABS 9.9* 10/03/2015 1835   NEUTROABS 3.3 10/09/2014 0554   LYMPHSABS 0.4* 10/03/2015 1835   LYMPHSABS 1.1 10/09/2014 0554   MONOABS 0.5 10/03/2015 1835   MONOABS 0.5 10/09/2014 0554   EOSABS 0.5 10/03/2015 1835    EOSABS 0.3 10/09/2014 0554   BASOSABS 0.0 10/03/2015 1835   BASOSABS 0.0 10/09/2014 0554   Comprehensive Metabolic Panel:    Component Value Date/Time   NA 138 10/04/2015 0320   NA 138 02/28/2015 1521   K 3.7 10/04/2015 0320   K 4.0 02/28/2015 1521  CL 111 10/04/2015 0320   CL 103 02/28/2015 1521   CO2 22 10/04/2015 0320   CO2 26 02/28/2015 1521   BUN 22* 10/04/2015 0320   BUN 15 02/28/2015 1521   CREATININE 1.05* 10/04/2015 0320   CREATININE 0.90 02/28/2015 1521   GLUCOSE 100* 10/04/2015 0320   GLUCOSE 145* 02/28/2015 1521   CALCIUM 8.2* 10/04/2015 0320   CALCIUM 10.0 02/28/2015 1521   AST 104* 10/04/2015 0320   AST 26 02/28/2015 1521   ALT 41 10/04/2015 0320   ALT 24 02/28/2015 1521   ALKPHOS 36* 10/04/2015 0320   ALKPHOS 65 02/28/2015 1521   BILITOT 0.6 10/04/2015 0320   BILITOT 0.6 02/28/2015 1521   PROT 5.9* 10/04/2015 0320   PROT 8.4* 02/28/2015 1521   ALBUMIN 2.7* 10/04/2015 0320   ALBUMIN 4.2 02/28/2015 1521    More than 50% of the visit was spent in counseling/coordination of care: Yes  Time Spent:  minutes

## 2015-10-06 NOTE — Discharge Summary (Signed)
[facility] - [place] at [facility]   PATIENT NAME: Cassandra LoganFrances Steele    MR#:  [id number]  DATE OF BIRTH:  09/15/1936  DATE OF ADMISSION:  10/03/2015 ADMITTING PHYSICIAN: Delfino LovettVipul Shah, MD  DATE OF DISCHARGE: 10/06/15  PRIMARY CARE PHYSICIAN: Lyndon CodeKHAN, FOZIA M, MD    ADMISSION DIAGNOSIS:  UTI (lower urinary tract infection) [N39.0] Sepsis, due to unspecified organism (HCC) [A41.9]  DISCHARGE DIAGNOSIS:  ESBL UTI  SECONDARY DIAGNOSIS:   Past Medical History  Diagnosis Date  . Hypertension   . Dementia   . Alzheimer's disease   . Thyroid disease   . Anemia   . Osteoarthritis   . Depression   . Mixed incontinence     HOSPITAL COURSE:  1. AMS with underlying demntia 2/2 acute cystitis Pt afebrile. Wbc normal Iv abx - zosyn  Blood cultures negative  2. Acute cystitis urine cx is greater than 100,000 colonies of ESbbl ecoli. curbsided Id and recommends NfT 100 mg bid for 10 days  3. chronic dementia  Haldol as needed for agitation  4. Poor po intake 2/2 #1 S/p ST eval, requesting dysphagia 1 diet. couldn't perform 2/2 ams Appreciate dietician recommendations  Pt art present medically stable for d/c to ALF CONSULTS OBTAINED:  Treatment Team:  Ramonita LabAruna Gouru, MD  DRUG ALLERGIES:  No Known Allergies  DISCHARGE MEDICATIONS:   Current Discharge Medication List    START taking these medications   Details  nitrofurantoin, macrocrystal-monohydrate, (MACROBID) 100 MG capsule Take 1 capsule (100 mg total) by mouth every 12 (twelve) hours. Qty: 20 capsule, Refills: 0      CONTINUE these medications which have NOT CHANGED   Details  acetaminophen (TYLENOL) 325 MG tablet Take 650 mg by mouth 2 (two) times daily as needed for mild pain or moderate pain.     aspirin 81 MG chewable tablet Chew 81 mg by mouth daily.    Calcium Carbonate-Vitamin D 600-400 MG-UNIT per tablet Take 1 tablet by mouth 2 (two) times daily.    ciprofloxacin (CIPRO) 500 MG  tablet Take 500 mg by mouth 2 (two) times daily.    citalopram (CELEXA) 40 MG tablet Take 40 mg by mouth at bedtime.    diclofenac sodium (VOLTAREN) 1 % GEL Apply 1 application topically 2 (two) times daily as needed (Apply to affected hips and knees twice a day as needed for joint pains.).     haloperidol (HALDOL) 0.5 MG tablet Take 0.5 mg by mouth See admin instructions. Take 1 tablet orally once a day and Take 1 tablet orally every 6 hours as needed for agitation.    hydrocortisone 2.5 % cream Apply 1 application topically 2 (two) times daily as needed. For rash.    hydrOXYzine (ATARAX/VISTARIL) 25 MG tablet Take 25 mg by mouth every 6 (six) hours as needed for itching (or agitation.).    levothyroxine (SYNTHROID, LEVOTHROID) 75 MCG tablet Take 75 mcg by mouth daily.    LORazepam (ATIVAN) 0.5 MG tablet Take 0.5 mg by mouth every 8 (eight) hours as needed (for agitation.).     pravastatin (PRAVACHOL) 10 MG tablet Take 10 mg by mouth at bedtime.    sulfamethoxazole-trimethoprim (BACTRIM DS,SEPTRA DS) 800-160 MG tablet Take 1 tablet by mouth 2 (two) times daily. Scheduled medication.    VESICARE 5 MG tablet Take 5 mg by mouth at bedtime.         If you experience worsening of your admission symptoms, develop shortness of breath, life threatening emergency, suicidal  or homicidal thoughts you must seek medical attention immediately by calling 911 or calling your MD immediately  if symptoms less severe.  You Must read complete instructions/literature along with all the possible adverse reactions/side effects for all the Medicines you take and that have been prescribed to you. Take any new Medicines after you have completely understood and accept all the possible adverse reactions/side effects.   Please note  You were cared for by a hospitalist during your hospital stay. If you have any questions about your discharge medications or the care you received while you were in the hospital after  you are discharged, you can call the unit and asked to speak with the hospitalist on call if the hospitalist that took care of you is not available. Once you are discharged, your primary care physician will handle any further medical issues. Please note that NO REFILLS for any discharge medications will be authorized once you are discharged, as it is imperative that you return to your primary care physician (or establish a relationship with a primary care physician if you do not have one) for your aftercare needs so that they can reassess your need for medications and monitor your lab values. Today   SUBJECTIVE   Confused due to dementia  VITAL SIGNS:  Blood pressure 116/66, pulse 61, temperature 97.9 F (36.6 C), temperature source Oral, resp. rate 18, height  (1.626 m), weight 68 kg (149 lb 14.6 oz), SpO2 98 %.  I/O:   Intake/Output Summary (Last 24 hours) at 10/06/15 1424 Last data filed at 10/06/15 1248  Gross per 24 hour  Intake 2943.34 ml  Output    950 ml  Net 1993.34 ml    PHYSICAL EXAMINATION:  GENERAL:  79 y.o.-year-old patient lying in the bed with no acute distress.  EYES: Pupils equal, round, reactive to light and accommodation. No scleral icterus. Extraocular muscles intact.  HEENT: Head atraumatic, normocephalic. Oropharynx and nasopharynx clear.  NECK:  Supple, no jugular venous distention. No thyroid enlargement, no tenderness.  LUNGS: Normal breath sounds bilaterally, no wheezing, rales,rhonchi or crepitation. No use of accessory muscles of respiration.  CARDIOVASCULAR: S1, S2 normal. No murmurs, rubs, or gallops.  ABDOMEN: Soft, non-tender, non-distended. Bowel sounds present. No organomegaly or mass.  EXTREMITIES: No pedal edema, cyanosis, or clubbing.  NEUROLOGIC: Cranial nerves II through XII are intact. Muscle strength 5/5 in all extremities. Sensation intact. Gait not checked.  PSYCHIATRIC: The patient is alert and oriented x 3.  SKIN: No obvious rash,  lesion, or ulcer.   DATA REVIEW:   CBC   Recent Labs Lab 10/06/15 0528  WBC 5.7  HGB 9.7*  HCT 28.6*  PLT 131*    Chemistries   Recent Labs Lab 10/04/15 0320  NA 138  K 3.7  CL 111  CO2 22  GLUCOSE 100*  BUN 22*  CREATININE 1.05*  CALCIUM 8.2*  AST 104*  ALT 41  ALKPHOS 36*  BILITOT 0.6    Microbiology Results   Recent Results (from the past 240 hour(s))  Urine culture     Status: None (Preliminary result)   Collection Time: 10/03/15  6:35 PM  Result Value Ref Range Status   Specimen Description URINE, RANDOM  Final   Special Requests NONE  Final   Culture   Final    >=100,000 COLONIES/mL ESCHERICHIA COLI Susceptibility Pattern Suggests Possibility of an Extended Spectrum Beta Lactamase Producer. Contact Laboratory Within 7 Days if Confirmation Warranted. Results Called to: RN [facility]UB[facility] 10/06/15  0830AM MLM SENDING TO [facility] FOR FOSFOMYCIN PER DR. FITZGERALD    Report Status PENDING  Incomplete   Organism ID, Bacteria ESCHERICHIA COLI  Final      Susceptibility   Escherichia coli - MIC*    AMPICILLIN >=32 RESISTANT Resistant     CEFTAZIDIME 4 RESISTANT Resistant     CEFAZOLIN >=64 RESISTANT Resistant     CEFTRIAXONE >=64 RESISTANT Resistant     CIPROFLOXACIN >=4 RESISTANT Resistant     GENTAMICIN <=1 SENSITIVE Sensitive     IMIPENEM <=0.25 SENSITIVE Sensitive     TRIMETH/SULFA >=320 RESISTANT Resistant     Extended ESBL POSITIVE Resistant     PIP/TAZO Value in next row Sensitive      SENSITIVE<=4    * >=100,000 COLONIES/mL ESCHERICHIA COLI  Blood Culture (routine x 2)     Status: None (Preliminary result)   Collection Time: 10/03/15  6:37 PM  Result Value Ref Range Status   Specimen Description BLOOD RIGHT AC  Final   Special Requests BOTTLES DRAWN AEROBIC AND ANAEROBIC  1CC  Final   Culture NO GROWTH 3 DAYS  Final   Report Status PENDING  Incomplete  Blood Culture (routine x 2)     Status: None (Preliminary result)   Collection Time:  10/03/15  6:37 PM  Result Value Ref Range Status   Specimen Description BLOOD LEFT AC  Final   Special Requests BOTTLES DRAWN AEROBIC AND ANAEROBIC  1CC  Final   Culture NO GROWTH 3 DAYS  Final   Report Status PENDING  Incomplete  MRSA PCR Screening     Status: None   Collection Time: 10/04/15 12:02 AM  Result Value Ref Range Status   MRSA by PCR NEGATIVE NEGATIVE Final    Comment:        The GeneXpert MRSA Assay (FDA approved for NASAL specimens only), is one component of a comprehensive MRSA colonization surveillance program. It is not intended to diagnose MRSA infection nor to guide or monitor treatment for MRSA infections.     RADIOLOGY:  No results found.   Management plans discussed with the patient, family and they are in agreement.  CODE STATUS:     Code Status Orders        Start     Ordered   10/03/15 2340  Full code   Continuous     10/03/15 2339      TOTAL TIME TAKING CARE OF THIS PATIENT:40 minutes.    Daielle Melcher M.D on 10/06/2015 at 2:24 PM  Between 7am to 6pm - Pager - [phone] After 6pm go to [email or web address] - password [email or web address] [facility]  [place] Hospitalists  Office  [phone]  CC: Primary care physician; Lyndon Code, MD

## 2015-10-06 NOTE — Progress Notes (Signed)
Called report several times unable to reach anyone. Last call at 1615 phone answered but then I was hung up on. 911 called.

## 2015-10-06 NOTE — Plan of Care (Signed)
Problem: Fluid Volume: Goal: Hemodynamic stability will improve Outcome: Progressing Pt Afebrile, BP 90-110s/60s, HR 60-70s, SpO2 >90% on RA. Pt fluids stopped at midnight. Pt remains on antibiotics. Pt A&Ox 0-1 does not follow commands and mainly has incomprehensible speech. Team aware

## 2015-10-06 NOTE — Progress Notes (Signed)
New referral for [name] services at [name] after discharge recieved from CSW [place]. Ms. Cassandra Steele is a 79 year old woman admitted to [name] on [facility]/20 for treatment of altered mental status r/t SIRS. She was found to have a UTI, she has received IV abt. She has a history of Alzheimer's dementia, HTN, depression and osteoarthritis. She is bed bound, incontinent of bladder and bowel and requires feeding. Writer spoke with patient's niece Wandalee FerdinandShelia Steele [phone]) to initiate education regarding hospice services, philosophy and team approach to care with good understanding voiced. Per CSW Marliss CzarBailey, [name] at [name] has also agreed for patient to return to [name] with hospice services. No DME  Needs identified. Patient is a Full Code. plan is for discharge today via EMS. All information faxed to referral intake. Thank you for the opportunity to be involved in the care of this patient.  Dayna BarkerKaren Robertson RN, BSN, [name], [facility] [phone] c

## 2015-10-06 NOTE — Progress Notes (Signed)
Palliative Care follow up: Communication between Liborio NixonJanice, Production designer, theatre/television/filmadministrator at [facility] and [place], CSW. Based on assessment and care team's communication with Liborio NixonJanice and patient's family, the plan will be for patient to return to the facility with Hospice services. Care team notified and Hospice consult will be scheduled.  Guilford ShiEmily Howell, MSW, LCSW Palliative Care social worker [phone] (c)

## 2015-10-06 NOTE — [place] FL2 (Signed)
[place] MEDICAID FL2 LEVEL OF CARE SCREENING TOOL     IDENTIFICATION  Patient Name: Cassandra Steele Birthdate: 1936/02/05 Sex: female Admission Date (Current Location): 10/03/2015  [facility] and [place] Number:  [facility] )  ([id number]) Facility and Address:  [facility], [address] [place], [place] [address]      Provider Number: [id number]  Attending Physician Name and Address:  Enedina Finner, MD  Relative Name and Phone Number:       Current Level of Care: Hospital Recommended Level of Care: Assisted Living Facility Prior Approval Number:    Date Approved/Denied:   PASRR Number:  ([id number])  Discharge Plan: Domiciliary (Rest home)Memory Care    Current Diagnoses: Alzheimer's Dementia- Primary   Patient Active Problem List   Diagnosis Date Noted  . UTI (lower urinary tract infection)   . SIRS (systemic inflammatory response syndrome) (HCC) 10/03/2015  . Recurrent UTI 10/03/2015  . Sepsis (HCC) 04/18/2015  . Acute UTI 04/18/2015  . Delirium superimposed on dementia 04/18/2015  . HTN, goal below 140/90 04/18/2015    Orientation ACTIVITIES/SOCIAL BLADDER RESPIRATION    Self, Time  Passive Incontinent, Indwelling catheter Normal  BEHAVIORAL SYMPTOMS/MOOD NEUROLOGICAL BOWEL NUTRITION STATUS   (none )  (none ) Continent Diet (DYS 1)  PHYSICIAN VISITS COMMUNICATION OF NEEDS Height & Weight Skin  30 days Verbally  (162.6 cm) 148 lbs. Normal          AMBULATORY STATUS RESPIRATION    Supervision limited Normal      Personal Care Assistance Level of Assistance  Bathing, Feeding, Dressing Bathing Assistance: Limited assistance Feeding assistance: Limited assistance Dressing Assistance: Limited assistance      Functional Limitations Info  Sight, Hearing, Speech Sight Info: Adequate Hearing Info: Adequate Speech Info: Adequate       SPECIAL CARE FACTORS FREQUENCY                      Additional Factors  Info  Code Status Code Status Info:  (Full Code. )             Current Medications (10/06/2015): Current Facility-Administered Medications  Medication Dose Route Frequency Provider Last Rate Last Dose  . acetaminophen (TYLENOL) tablet 650 mg  650 mg Oral Q6H PRN Marguarite Arbour, MD       Or  . acetaminophen (TYLENOL) suppository 650 mg  650 mg Rectal Q6H PRN Marguarite Arbour, MD      . aspirin chewable tablet 81 mg  81 mg Oral Daily Marguarite Arbour, MD   81 mg at 10/06/15 0956  . bisacodyl (DULCOLAX) suppository 10 mg  10 mg Rectal Daily PRN Marguarite Arbour, MD      . citalopram (CELEXA) tablet 40 mg  40 mg Oral QHS Marguarite Arbour, MD   40 mg at 10/05/15 2128  . darifenacin (ENABLEX) 24 hr tablet 7.5 mg  7.5 mg Oral Daily Marguarite Arbour, MD   7.5 mg at 10/06/15 0956  . docusate sodium (COLACE) capsule 100 mg  100 mg Oral BID Marguarite Arbour, MD   100 mg at 10/06/15 0956  . feeding supplement (ENSURE ENLIVE) (ENSURE ENLIVE) liquid 237 mL  237 mL Oral BID AC Aruna Gouru, MD   237 mL at 10/06/15 0959  . haloperidol lactate (HALDOL) injection 1 mg  1 mg Intravenous Q6H PRN Marguarite Arbour, MD      . heparin injection 5,000 Units  5,000 Units Subcutaneous 3 times  per day Marguarite Arbour, MD   5,000 Units at 10/06/15 1231  . hydrOXYzine (ATARAX/VISTARIL) tablet 25 mg  25 mg Oral Q6H PRN Marguarite Arbour, MD      . levothyroxine (SYNTHROID, LEVOTHROID) tablet 75 mcg  75 mcg Oral QAC breakfast Marguarite Arbour, MD   75 mcg at 10/06/15 0956  . LORazepam (ATIVAN) tablet 0.5 mg  0.5 mg Oral Q8H PRN Marguarite Arbour, MD      . morphine 2 MG/ML injection 2 mg  2 mg Intravenous Q2H PRN Marguarite Arbour, MD      . nitrofurantoin (macrocrystal-monohydrate) (MACROBID) capsule 100 mg  100 mg Oral Q12H Enedina Finner, MD      . ondansetron [facility]) tablet 4 mg  4 mg Oral Q6H PRN Marguarite Arbour, MD       Or  . ondansetron [facility]) injection 4 mg  4 mg Intravenous Q6H PRN Marguarite Arbour, MD      .  piperacillin-tazobactam (ZOSYN) IVPB 3.375 g  3.375 g Intravenous 3 times per day Marguarite Arbour, MD   3.375 g at 10/06/15 1610  . pravastatin (PRAVACHOL) tablet 10 mg  10 mg Oral QHS Marguarite Arbour, MD   10 mg at 10/05/15 2129  . vancomycin (VANCOCIN) IVPB 1000 mg/200 mL premix  1,000 mg Intravenous Q24H Marguarite Arbour, MD   1,000 mg at 10/06/15 1002   Do not use this list as official medication orders. Please verify with discharge summary.  Discharge Medications:   Medication List    TAKE these medications        acetaminophen 325 MG tablet  Commonly known as:  TYLENOL  Take 650 mg by mouth 2 (two) times daily as needed for mild pain or moderate pain.     aspirin 81 MG chewable tablet  Chew 81 mg by mouth daily.     Calcium Carbonate-Vitamin D 600-400 MG-UNIT tablet  Take 1 tablet by mouth 2 (two) times daily.     ciprofloxacin 500 MG tablet  Commonly known as:  CIPRO  Take 500 mg by mouth 2 (two) times daily.     citalopram 40 MG tablet  Commonly known as:  CELEXA  Take 40 mg by mouth at bedtime.     diclofenac sodium 1 % Gel  Commonly known as:  VOLTAREN  Apply 1 application topically 2 (two) times daily as needed (Apply to affected hips and knees twice a day as needed for joint pains.).     haloperidol 0.5 MG tablet  Commonly known as:  HALDOL  Take 0.5 mg by mouth See admin instructions. Take 1 tablet orally once a day and Take 1 tablet orally every 6 hours as needed for agitation.     hydrocortisone 2.5 % cream  Apply 1 application topically 2 (two) times daily as needed. For rash.     hydrOXYzine 25 MG tablet  Commonly known as:  ATARAX/VISTARIL  Take 25 mg by mouth every 6 (six) hours as needed for itching (or agitation.).     levothyroxine 75 MCG tablet  Commonly known as:  SYNTHROID, LEVOTHROID  Take 75 mcg by mouth daily.     LORazepam 0.5 MG tablet  Commonly known as:  ATIVAN  Take 0.5 mg by mouth every 8 (eight) hours as needed (for agitation.).      nitrofurantoin (macrocrystal-monohydrate) 100 MG capsule  Commonly known as:  MACROBID  Take 1 capsule (100 mg total) by mouth every 12 (twelve) hours.  pravastatin 10 MG tablet  Commonly known as:  PRAVACHOL  Take 10 mg by mouth at bedtime.     sulfamethoxazole-trimethoprim 800-160 MG tablet  Commonly known as:  BACTRIM DS,SEPTRA DS  Take 1 tablet by mouth 2 (two) times daily. Scheduled medication.     VESICARE 5 MG tablet  Generic drug:  solifenacin  Take 5 mg by mouth at bedtime.        Relevant Imaging Results:  Relevant Lab Results:  Recent Labs    Additional Information  [facility].   Haig ProphetMorgan, Donta Fuster G, LCSW

## 2015-10-06 NOTE — Progress Notes (Signed)
Patient is medically stable for D/C back to [facility] today. [place]/ [name] will follow patient once they receive order from MD. RN will call report and arrange EMS for transport. Clinical Child psychotherapistocial Worker (CSW) sent D/C summary and FL2 to [employer] [facility] administrator. Per Liborio NixonJanice patient can return today with. CSW contacted patient's niece Sheryle SprayMary Bryan and made her aware of above. N[facility] who explained hospice services. Please reconsult if future social work needs arise. CSW signing off.   Jetta LoutBailey Morgan, LCSWA [phone]

## 2015-10-08 ENCOUNTER — Encounter: Payer: Self-pay | Admitting: Emergency Medicine

## 2015-10-08 ENCOUNTER — Emergency Department: Payer: Medicare Other

## 2015-10-08 ENCOUNTER — Inpatient Hospital Stay
Admission: EM | Admit: 2015-10-08 | Discharge: 2015-10-10 | DRG: 389 | Payer: Medicare Other | Attending: Internal Medicine | Admitting: Internal Medicine

## 2015-10-08 DIAGNOSIS — I10 Essential (primary) hypertension: Secondary | ICD-10-CM | POA: Diagnosis present

## 2015-10-08 DIAGNOSIS — N390 Urinary tract infection, site not specified: Secondary | ICD-10-CM | POA: Diagnosis present

## 2015-10-08 DIAGNOSIS — K5900 Constipation, unspecified: Secondary | ICD-10-CM | POA: Diagnosis present

## 2015-10-08 DIAGNOSIS — R109 Unspecified abdominal pain: Secondary | ICD-10-CM | POA: Diagnosis present

## 2015-10-08 DIAGNOSIS — M1990 Unspecified osteoarthritis, unspecified site: Secondary | ICD-10-CM | POA: Diagnosis present

## 2015-10-08 DIAGNOSIS — G309 Alzheimer's disease, unspecified: Secondary | ICD-10-CM | POA: Diagnosis present

## 2015-10-08 DIAGNOSIS — Z79899 Other long term (current) drug therapy: Secondary | ICD-10-CM

## 2015-10-08 DIAGNOSIS — E039 Hypothyroidism, unspecified: Secondary | ICD-10-CM | POA: Diagnosis present

## 2015-10-08 DIAGNOSIS — K567 Ileus, unspecified: Principal | ICD-10-CM | POA: Diagnosis present

## 2015-10-08 DIAGNOSIS — Z7982 Long term (current) use of aspirin: Secondary | ICD-10-CM | POA: Diagnosis not present

## 2015-10-08 DIAGNOSIS — Z1612 Extended spectrum beta lactamase (ESBL) resistance: Secondary | ICD-10-CM | POA: Diagnosis present

## 2015-10-08 DIAGNOSIS — F0280 Dementia in other diseases classified elsewhere without behavioral disturbance: Secondary | ICD-10-CM | POA: Diagnosis present

## 2015-10-08 LAB — COMPREHENSIVE METABOLIC PANEL
ALBUMIN: 3 g/dL — AB (ref 3.5–5.0)
ALK PHOS: 39 U/L (ref 38–126)
ALT: 35 U/L (ref 14–54)
ANION GAP: 8 (ref 5–15)
AST: 30 U/L (ref 15–41)
BUN: 14 mg/dL (ref 6–20)
CALCIUM: 9.1 mg/dL (ref 8.9–10.3)
CO2: 21 mmol/L — AB (ref 22–32)
Chloride: 105 mmol/L (ref 101–111)
Creatinine, Ser: 0.82 mg/dL (ref 0.44–1.00)
GFR calc Af Amer: >60 mL/min (ref >60)
GFR calc non Af Amer: >60 mL/min (ref >60)
GLUCOSE: 138 mg/dL — AB (ref 65–99)
Potassium: 3.5 mmol/L (ref 3.5–5.1)
SODIUM: 134 mmol/L — AB (ref 135–145)
Total Bilirubin: 0.9 mg/dL (ref 0.3–1.2)
Total Protein: 7 g/dL (ref 6.5–8.1)

## 2015-10-08 LAB — CBC WITH DIFFERENTIAL/PLATELET
BASOS PCT: 1 %
Basophils Absolute: 0.1 10*3/uL (ref 0–0.1)
EOS ABS: 0.5 10*3/uL (ref 0–0.7)
Eosinophils Relative: 4 %
HEMATOCRIT: 31.7 % — AB (ref 35.0–47.0)
HEMOGLOBIN: 10.9 g/dL — AB (ref 12.0–16.0)
LYMPHS PCT: 13 %
Lymphs Abs: 1.7 10*3/uL (ref 1.0–3.6)
MCH: 30.8 pg (ref 26.0–34.0)
MCHC: 34.3 g/dL (ref 32.0–36.0)
MCV: 89.8 fL (ref 80.0–100.0)
MONOS PCT: 5 %
Monocytes Absolute: 0.7 10*3/uL (ref 0.2–0.9)
NEUTROS ABS: 10.2 10*3/uL — AB (ref 1.4–6.5)
Neutrophils Relative %: 77 %
Platelets: 166 10*3/uL (ref 150–440)
RBC: 3.53 MIL/uL — ABNORMAL LOW (ref 3.80–5.20)
RDW: 13.4 % (ref 11.5–14.5)
Smear Review: ADEQUATE
WBC: 13.2 10*3/uL — ABNORMAL HIGH (ref 3.6–11.0)

## 2015-10-08 LAB — URINALYSIS COMPLETE WITH MICROSCOPIC (ARMC ONLY)
BACTERIA UA: NONE SEEN
Bilirubin Urine: NEGATIVE
Glucose, UA: NEGATIVE mg/dL
Hgb urine dipstick: NEGATIVE
Leukocytes, UA: NEGATIVE
Nitrite: NEGATIVE
PROTEIN: NEGATIVE mg/dL
RBC / HPF: NONE SEEN RBC/hpf (ref 0–5)
Specific Gravity, Urine: 1.017 (ref 1.005–1.030)
pH: 7 (ref 5.0–8.0)

## 2015-10-08 LAB — TROPONIN I: Troponin I: <0.03 ng/mL (ref <0.031)

## 2015-10-08 LAB — CULTURE, BLOOD (ROUTINE X 2)
CULTURE: NO GROWTH
Culture: NO GROWTH

## 2015-10-08 LAB — LIPASE, BLOOD: LIPASE: 18 U/L (ref 11–51)

## 2015-10-08 MED ORDER — IOHEXOL 300 MG/ML  SOLN
100.0000 mL | Freq: Once | INTRAMUSCULAR | Status: AC | PRN
  Administered 2015-10-08: 100 mL via INTRAVENOUS

## 2015-10-08 MED ORDER — IOHEXOL 240 MG/ML SOLN
25.0000 mL | Freq: Once | INTRAMUSCULAR | Status: AC | PRN
  Administered 2015-10-08: 50 mL via ORAL

## 2015-10-08 NOTE — ED Notes (Signed)
Pt unable to finish oral contrast due to dementia.  Situation discussed with CT tech and MD, agreed to complete CT w/ iv contrast only.  Pt taken to CT.

## 2015-10-08 NOTE — H&P (Signed)
[facility] [place] [facility]   PATIENT NAME: Cassandra Steele    MR#:  [id number]  DATE OF BIRTH:  08/28/1936  DATE OF ADMISSION:  10/08/2015  PRIMARY CARE PHYSICIAN: Lyndon Code, MD   REQUESTING/REFERRING PHYSICIAN: Fanny Bien, M.D.  CHIEF COMPLAINT:   Chief Complaint  Patient presents with  . Dysuria  . Abdominal Pain  . Fever    HISTORY OF PRESENT ILLNESS:  Cassandra Steele  is a 79 y.o. female who presents with abdominal pain. Patient has significant dementia and is nonverbal and is unable to contribute information to her history. She was sent from her nursing facility for pain and abdominal pain. In the ED she was found to have very mild leukocytosis. Lab workup otherwise largely unremarkable, including benign UA. However, on CT abdomen and pelvis she was found to have likely mild ileus with severe constipation and significant stool burden. CT scan did not look suggestive of diverticulitis or colitis. Hospitalists were called for admission for ileus.  PAST MEDICAL HISTORY:   Past Medical History  Diagnosis Date  . Hypertension   . Dementia   . Alzheimer's disease   . Thyroid disease   . Anemia   . Osteoarthritis   . Depression   . Mixed incontinence     PAST SURGICAL HISTORY:   Past Surgical History  Procedure Laterality Date  . No past surgeries      SOCIAL HISTORY:   Social History  Substance Use Topics  . Smoking status: Never Smoker   . Smokeless tobacco: Not on file  . Alcohol Use: No    FAMILY HISTORY:   Family History  Problem Relation Age of Onset  . Family history unknown: Yes    DRUG ALLERGIES:  No Known Allergies  MEDICATIONS AT HOME:   Prior to Admission medications   Medication Sig Start Date End Date Taking? Authorizing Provider  acetaminophen (TYLENOL) 325 MG tablet Take 650 mg by mouth 2 (two) times daily as needed for mild pain or moderate pain.    Yes Historical Provider, MD  aspirin 81 MG chewable  tablet Chew 81 mg by mouth daily.   Yes Historical Provider, MD  Calcium Carbonate-Vitamin D 600-400 MG-UNIT per tablet Take 1 tablet by mouth 2 (two) times daily.   Yes Historical Provider, MD  ciprofloxacin (CIPRO) 500 MG tablet Take 500 mg by mouth 2 (two) times daily. 09/30/15 10/11/15 Yes Historical Provider, MD  citalopram (CELEXA) 40 MG tablet Take 40 mg by mouth at bedtime. 04/16/15  Yes Historical Provider, MD  diclofenac sodium (VOLTAREN) 1 % GEL Apply 1 application topically 2 (two) times daily as needed (Apply to affected hips and knees twice a day as needed for joint pains.).  04/15/15  Yes Historical Provider, MD  haloperidol (HALDOL) 0.5 MG tablet Take 0.5 mg by mouth See admin instructions. Take 1 tablet orally once a day and Take 1 tablet orally every 6 hours as needed for agitation. 04/15/15  Yes Historical Provider, MD  hydrocortisone 2.5 % cream Apply 1 application topically 2 (two) times daily as needed. For rash. 04/15/15  Yes Historical Provider, MD  hydrOXYzine (ATARAX/VISTARIL) 25 MG tablet Take 25 mg by mouth every 6 (six) hours as needed for itching (or agitation.).   Yes Historical Provider, MD  levothyroxine (SYNTHROID, LEVOTHROID) 75 MCG tablet Take 75 mcg by mouth daily. 04/02/15  Yes Historical Provider, MD  LORazepam (ATIVAN) 0.5 MG tablet Take 0.5 mg by mouth every 8 (eight) hours as  needed (for agitation.).  03/15/15  Yes Historical Provider, MD  nitrofurantoin, macrocrystal-monohydrate, (MACROBID) 100 MG capsule Take 1 capsule (100 mg total) by mouth every 12 (twelve) hours. 10/06/15  Yes Enedina FinnerSona Patel, MD  pravastatin (PRAVACHOL) 10 MG tablet Take 10 mg by mouth at bedtime. 04/15/15  Yes Historical Provider, MD  sulfamethoxazole-trimethoprim (BACTRIM DS,SEPTRA DS) 800-160 MG tablet Take 1 tablet by mouth 2 (two) times daily. Scheduled medication.   Yes Historical Provider, MD  VESICARE 5 MG tablet Take 5 mg by mouth at bedtime.  04/15/15  Yes Historical Provider, MD    REVIEW OF  SYSTEMS:  Review of Systems  Unable to perform ROS: dementia     VITAL SIGNS:   Filed Vitals:   10/08/15 2100 10/08/15 2230 10/08/15 2300 10/08/15 2330  BP: 122/57 131/78 115/67 133/71  Pulse: 99 101 74   Temp:      TempSrc:      Resp: 32 30 30 25   SpO2: 96% 94%     Wt Readings from Last 3 Encounters:  10/06/15 68 kg (149 lb 14.6 oz)  04/21/15 70.081 kg (154 lb 8 oz)    PHYSICAL EXAMINATION:  Physical Exam  Vitals reviewed. Constitutional: She appears well-developed and well-nourished. No distress.  HENT:  Head: Normocephalic and atraumatic.  Mouth/Throat: Oropharynx is clear and moist.  Eyes: Conjunctivae and EOM are normal. Pupils are equal, round, and reactive to light. No scleral icterus.  Neck: Normal range of motion. Neck supple. No JVD present. No thyromegaly present.  Cardiovascular: Normal rate, regular rhythm and intact distal pulses.  Exam reveals no gallop and no friction rub.   No murmur heard. Respiratory: Effort normal and breath sounds normal. No respiratory distress. She has no wheezes. She has no rales.  GI: Soft. Bowel sounds are normal. She exhibits distension (mild). There is tenderness (Diffuse to palpation).  Musculoskeletal: Normal range of motion. She exhibits no edema.  No arthritis, no gout  Lymphadenopathy:    She has no cervical adenopathy.  Neurological: She is alert. No cranial nerve deficit.  Unable to assess as patient is nonverbal, does not follow commands.   Skin: Skin is warm and dry. No rash noted. No erythema.  Psychiatric:  Unable to assess due to patient's dementia.    LABORATORY PANEL:   CBC  Recent Labs Lab 10/08/15 1947  WBC 13.2*  HGB 10.9*  HCT 31.7*  PLT 166   ------------------------------------------------------------------------------------------------------------------  Chemistries   Recent Labs Lab 10/08/15 1947  NA 134*  K 3.5  CL 105  CO2 21*  GLUCOSE 138*  BUN 14  CREATININE 0.82  CALCIUM 9.1   AST 30  ALT 35  ALKPHOS 39  BILITOT 0.9   ------------------------------------------------------------------------------------------------------------------  Cardiac Enzymes  Recent Labs Lab 10/08/15 1947  TROPONINI <0.03   ------------------------------------------------------------------------------------------------------------------  RADIOLOGY:  Ct Abdomen Pelvis W Contrast  10/08/2015  CLINICAL DATA:  Abdominal pain, abdominal distension EXAM: CT ABDOMEN AND PELVIS WITH CONTRAST TECHNIQUE: Multidetector CT imaging of the abdomen and pelvis was performed using the standard protocol following bolus administration of intravenous contrast. CONTRAST:  100mL OMNIPAQUE IOHEXOL 300 MG/ML  SOLN COMPARISON:  10/19/2006 FINDINGS: Sagittal images of the spine shows mild degenerative changes thoracolumbar spine. Bilateral trace pleural effusion with bilateral basilar posterior atelectasis. Enhanced liver shows no focal mass. The pancreas spleen and adrenal glands are unremarkable. No aortic aneurysm. Small hiatal hernia. Enhanced kidneys are symmetrical in size. No hydronephrosis or hydroureter. Delayed renal images shows bilateral renal symmetrical excretion. Bilateral visualized  proximal ureter is unremarkable. Abundant stool noted in right colon and hepatic flexure of the colon. Abundant gas noted within cecum. No pericecal inflammation. The terminal ileum is unremarkable. Moderate gas and stool noted within redundant transverse colon. Moderate gas noted within and descending colon. Colonic diverticula are noted in descending colon. There is no evidence of acute diverticulitis. Mild gaseous distended small bowel loops in mid abdomen probable mild ileus. No small bowel air-fluid levels. Again noted enlarged multi lobulated uterus with multiple calcified exophytic fibroids. The largest right fundal fibroid measures 5.7 cm. The largest left fundal calcified fibroid measures 3.4 cm. Exophytic left lower  uterine segment fibroid measures 7.2 cm. The uterus measures 12 by 13.6 cm. The endometrial cavity is distorted. Moderate stool noted within rectum which is distended measures 7.1 cm in diameter suspicious for mild fecal impaction. There is a Foley catheter within decompressed urinary bladder. IMPRESSION: 1. Small hiatal hernia. Bilateral small pleural effusion with bilateral basilar posterior atelectasis. 2. Abundant stool noted in right colon and hepatic flexure of the colon. Moderate gas and stool noted within tortuous transverse colon. Descending colon diverticula are noted. No evidence of acute diverticulitis. 3. There is markedly enlarged multinodular uterus with multiple calcified exophytic fibroids. The uterus measures at least 12 x 13.6 cm. The largest calcified fibroids in left lower uterine segment measures 7.2 cm. 4. Moderate stool noted within rectum. Mild fecal impaction cannot be excluded. Mild gaseous distended small bowel loops probable mild ileus. No air-fluid levels are noted within small bowel. 5. No hydronephrosis or hydroureter. 6. There is a Foley catheter within decompressed urinary bladder Electronically Signed   By: Natasha Mead M.D.   On: 10/08/2015 21:59    EKG:   Orders placed or performed during the hospital encounter of 04/18/15  . ED EKG  . ED EKG    IMPRESSION AND PLAN:  Principal Problem:   Ileus (HCC) - nothing by mouth for now except for important home meds, and monitor. Cleanout significant stool burden and constipation with enema as below. We'll not place NG tube at this time. Active Problems:   Abdominal pain - likely due to significant severe constipation and stool burden, treat as below.   Constipation - enema ordered in the ED, per ED physician's exam patient did not have fecal impaction. We'll monitor for improvement after enema.   Alzheimer's dementia - continue home meds   HTN (hypertension) - at goal, continue home meds   Hypothyroidism - continue home  dose replacement  All the records are reviewed and case discussed with ED provider. Management plans discussed with the patient and/or family.  DVT PROPHYLAXIS: SubQ lovenox  ADMISSION STATUS: Inpatient  CODE STATUS: Full. Family was unable to be contacted this time. This is based on prior chart review as the patient was not able to have this conversation. However, nursing facility and/or family will need to be contacted in order to clarify this.  TOTAL TIME TAKING CARE OF THIS PATIENT: 40 minutes.    Lataunya Ruud FIELDING 10/08/2015, 11:49 PM  [facility]  Office  [phone]  CC: Primary care physician; Lyndon Code, MD

## 2015-10-08 NOTE — ED Notes (Signed)
Per EMS:  Axillary temp with ems 100.6 just dc from  wednesday for urosepsis noticed that abd was slightly distended, c/o pain.  abd is tight.  20 guage left wrist 100 cc en route cbg 136.  129/82.  from [facility].  HR 90.  hx of alzheimers.   Nonverbal at this time.  Patient grimacing and moaning

## 2015-10-08 NOTE — ED Provider Notes (Signed)
[facility] Emergency Department Provider Note  Time seen: 8:11 PM  I have reviewed the triage vital signs and the nursing notes.   HISTORY  Chief Complaint Dysuria; Abdominal Pain; and Fever    HPI Cassandra Steele is a 79 y.o. female with a past medical history of hypertension, anemia, depression, Alzheimer's dementia, per report nonverbal at baseline, who presents the emergency department with abdominal pain. According to report the patient appeared to be grimacing in discomfort especially when staff push on her lower abdomen. They also thought the lower abdomen felt somewhat distended. Patient was recently discharged from the hospital several days ago after an admission for altered mental status and urinary tract infection. Patient is currently afebrile in the emergency department at 99.2.      Past Medical History  Diagnosis Date  . Hypertension   . Dementia   . Alzheimer's disease   . Thyroid disease   . Anemia   . Osteoarthritis   . Depression   . Mixed incontinence     Patient Active Problem List   Diagnosis Date Noted  . UTI (lower urinary tract infection)   . SIRS (systemic inflammatory response syndrome) (HCC) 10/03/2015  . Recurrent UTI 10/03/2015  . Sepsis (HCC) 04/18/2015  . Acute UTI 04/18/2015  . Delirium superimposed on dementia 04/18/2015  . HTN, goal below 140/90 04/18/2015    History reviewed. No pertinent past surgical history.  Current Outpatient Rx  Name  Route  Sig  Dispense  Refill  . acetaminophen (TYLENOL) 325 MG tablet   Oral   Take 650 mg by mouth 2 (two) times daily as needed for mild pain or moderate pain.          Marland Kitchen. aspirin 81 MG chewable tablet   Oral   Chew 81 mg by mouth daily.         . Calcium Carbonate-Vitamin D 600-400 MG-UNIT per tablet   Oral   Take 1 tablet by mouth 2 (two) times daily.         . ciprofloxacin (CIPRO) 500 MG tablet   Oral   Take 500 mg by mouth 2 (two) times daily.          . citalopram (CELEXA) 40 MG tablet   Oral   Take 40 mg by mouth at bedtime.         . diclofenac sodium (VOLTAREN) 1 % GEL   Topical   Apply 1 application topically 2 (two) times daily as needed (Apply to affected hips and knees twice a day as needed for joint pains.).          Marland Kitchen. haloperidol (HALDOL) 0.5 MG tablet   Oral   Take 0.5 mg by mouth See admin instructions. Take 1 tablet orally once a day and Take 1 tablet orally every 6 hours as needed for agitation.         . hydrocortisone 2.5 % cream   Topical   Apply 1 application topically 2 (two) times daily as needed. For rash.         . hydrOXYzine (ATARAX/VISTARIL) 25 MG tablet   Oral   Take 25 mg by mouth every 6 (six) hours as needed for itching (or agitation.).         Marland Kitchen. levothyroxine (SYNTHROID, LEVOTHROID) 75 MCG tablet   Oral   Take 75 mcg by mouth daily.         Marland Kitchen. LORazepam (ATIVAN) 0.5 MG tablet   Oral   Take 0.5  mg by mouth every 8 (eight) hours as needed (for agitation.).          Marland Kitchen nitrofurantoin, macrocrystal-monohydrate, (MACROBID) 100 MG capsule   Oral   Take 1 capsule (100 mg total) by mouth every 12 (twelve) hours.   20 capsule   0   . pravastatin (PRAVACHOL) 10 MG tablet   Oral   Take 10 mg by mouth at bedtime.         . sulfamethoxazole-trimethoprim (BACTRIM DS,SEPTRA DS) 800-160 MG tablet   Oral   Take 1 tablet by mouth 2 (two) times daily. Scheduled medication.         . VESICARE 5 MG tablet   Oral   Take 5 mg by mouth at bedtime.            Dispense as written.     Allergies Review of patient's allergies indicates no known allergies.  Family History  Problem Relation Age of Onset  . Family history unknown: Yes    Social History Social History  Substance Use Topics  . Smoking status: Never Smoker   . Smokeless tobacco: None  . Alcohol Use: No    Review of Systems Unable to obtain a review of systems due to dementia and nonverbal  baseline  ____________________________________________   PHYSICAL EXAM:  VITAL SIGNS: ED Triage Vitals  Enc Vitals Group     BP 10/08/15 1949 122/73 mmHg     Pulse Rate 10/08/15 1949 91     Resp 10/08/15 1949 16     Temp 10/08/15 1949 99.2 F (37.3 C)     Temp Source 10/08/15 1949 Oral     SpO2 10/08/15 1949 100 %     Weight --      Height --      Head Cir --      Peak Flow --      Pain Score 10/08/15 1954 8     Pain Loc --      Pain Edu? --      Excl. in GC? --     Constitutional: Alert, however does not answer questions or follow commands, likely her baseline. Eyes: Normal exam ENT   Head: Normocephalic and atraumatic   Mouth/Throat: Mucous membranes are moist. Cardiovascular: Normal rate, regular rhythm.  Respiratory: Normal respiratory effort without tachypnea nor retractions. Breath sounds are clear Gastrointestinal: Soft, the patient does grimace somewhat when pushing in the suprapubic area. Musculoskeletal: Appears to move all extremities without issue. Neurologic:   Nonverbal, likely at baseline  Skin:  Skin is warm, dry and intact.   ____________________________________________     RADIOLOGY  CT pending  ____________________________________________   INITIAL IMPRESSION / ASSESSMENT AND PLAN / ED COURSE  Pertinent labs & imaging results that were available during my care of the patient were reviewed by me and considered in my medical decision making (see chart for details).  patient presents for possible discomfort, lower abdominal distention. Greater than 300 cc on bladder scan, we have placed a Foley catheter. We will check labs, and closely monitor the patient in the emergency department.   Labs are largely within normal limits besides a leukocytosis. As the patient is nonverbal and makes her examination quite difficult. Given her grimacing we'll proceed with a CT scan of her abdomen/pelvis to rule out intra-abdominal pathology.   Patient  Care signed out to Dr. Fanny Bien  ____________________________________________   FINAL diagnosis:  Abdominal pain  Minna Antis, MD 10/08/15 2100

## 2015-10-09 LAB — BASIC METABOLIC PANEL
ANION GAP: 7 (ref 5–15)
BUN: 14 mg/dL (ref 6–20)
CALCIUM: 9.1 mg/dL (ref 8.9–10.3)
CO2: 23 mmol/L (ref 22–32)
Chloride: 104 mmol/L (ref 101–111)
Creatinine, Ser: 0.83 mg/dL (ref 0.44–1.00)
GLUCOSE: 118 mg/dL — AB (ref 65–99)
Potassium: 3.2 mmol/L — ABNORMAL LOW (ref 3.5–5.1)
Sodium: 134 mmol/L — ABNORMAL LOW (ref 135–145)

## 2015-10-09 LAB — CBC
HCT: 30.3 % — ABNORMAL LOW (ref 35.0–47.0)
HEMOGLOBIN: 10.2 g/dL — AB (ref 12.0–16.0)
MCH: 30.2 pg (ref 26.0–34.0)
MCHC: 33.7 g/dL (ref 32.0–36.0)
MCV: 89.6 fL (ref 80.0–100.0)
PLATELETS: 169 10*3/uL (ref 150–440)
RBC: 3.38 MIL/uL — ABNORMAL LOW (ref 3.80–5.20)
RDW: 13.5 % (ref 11.5–14.5)
WBC: 13.6 10*3/uL — ABNORMAL HIGH (ref 3.6–11.0)

## 2015-10-09 MED ORDER — SENNOSIDES-DOCUSATE SODIUM 8.6-50 MG PO TABS
1.0000 | ORAL_TABLET | Freq: Two times a day (BID) | ORAL | Status: DC
  Administered 2015-10-09 – 2015-10-10 (×3): 1 via ORAL
  Filled 2015-10-09 (×3): qty 1

## 2015-10-09 MED ORDER — ENOXAPARIN SODIUM 40 MG/0.4ML ~~LOC~~ SOLN
40.0000 mg | SUBCUTANEOUS | Status: DC
  Administered 2015-10-09 – 2015-10-10 (×2): 40 mg via SUBCUTANEOUS
  Filled 2015-10-09 (×2): qty 0.4

## 2015-10-09 MED ORDER — SODIUM CHLORIDE 0.9 % IV SOLN
INTRAVENOUS | Status: AC
  Administered 2015-10-09: 02:00:00 via INTRAVENOUS

## 2015-10-09 MED ORDER — ACETAMINOPHEN 650 MG RE SUPP
650.0000 mg | Freq: Four times a day (QID) | RECTAL | Status: DC | PRN
  Administered 2015-10-09: 650 mg via RECTAL
  Filled 2015-10-09: qty 1

## 2015-10-09 MED ORDER — SODIUM CHLORIDE 0.9 % IJ SOLN
3.0000 mL | Freq: Two times a day (BID) | INTRAMUSCULAR | Status: DC
  Administered 2015-10-09 – 2015-10-10 (×4): 3 mL via INTRAVENOUS

## 2015-10-09 MED ORDER — NITROFURANTOIN MONOHYD MACRO 100 MG PO CAPS
100.0000 mg | ORAL_CAPSULE | Freq: Two times a day (BID) | ORAL | Status: DC
  Administered 2015-10-09 – 2015-10-10 (×3): 100 mg via ORAL
  Filled 2015-10-09 (×3): qty 1

## 2015-10-09 MED ORDER — FLEET ENEMA 7-19 GM/118ML RE ENEM
1.0000 | ENEMA | Freq: Once | RECTAL | Status: AC
  Administered 2015-10-09: 1 via RECTAL

## 2015-10-09 MED ORDER — LACTULOSE 10 GM/15ML PO SOLN
30.0000 g | Freq: Three times a day (TID) | ORAL | Status: DC
  Administered 2015-10-09: 30 g via ORAL
  Filled 2015-10-09 (×2): qty 60

## 2015-10-09 MED ORDER — ONDANSETRON HCL 4 MG PO TABS: 4.0000 mg | ORAL_TABLET | Freq: Four times a day (QID) | ORAL | Status: DC | PRN

## 2015-10-09 MED ORDER — ASPIRIN 81 MG PO CHEW
81.0000 mg | CHEWABLE_TABLET | Freq: Every day | ORAL | Status: DC
  Administered 2015-10-09 – 2015-10-10 (×2): 81 mg via ORAL
  Filled 2015-10-09 (×2): qty 1

## 2015-10-09 MED ORDER — LEVOTHYROXINE SODIUM 75 MCG PO TABS
75.0000 ug | ORAL_TABLET | Freq: Every day | ORAL | Status: DC
  Administered 2015-10-09 – 2015-10-10 (×2): 75 ug via ORAL
  Filled 2015-10-09 (×2): qty 1

## 2015-10-09 MED ORDER — ONDANSETRON HCL 4 MG/2ML IJ SOLN: 4.0000 mg | Freq: Four times a day (QID) | INTRAMUSCULAR | Status: DC | PRN

## 2015-10-09 MED ORDER — CITALOPRAM HYDROBROMIDE 20 MG PO TABS
40.0000 mg | ORAL_TABLET | Freq: Every day | ORAL | Status: DC
  Administered 2015-10-09 (×2): 40 mg via ORAL
  Filled 2015-10-09 (×2): qty 2

## 2015-10-09 MED ORDER — ACETAMINOPHEN 325 MG PO TABS
650.0000 mg | ORAL_TABLET | Freq: Four times a day (QID) | ORAL | Status: DC | PRN
  Filled 2015-10-09: qty 2

## 2015-10-09 MED ORDER — BISACODYL 5 MG PO TBEC
10.0000 mg | DELAYED_RELEASE_TABLET | Freq: Every day | ORAL | Status: DC
  Administered 2015-10-09: 10 mg via ORAL
  Filled 2015-10-09: qty 2

## 2015-10-09 NOTE — ED Provider Notes (Signed)
Reviewed CT.  Performed a rectal exam, brown heme-negative stool. Escorted by RN, there is stool noted in the rectal vault but no clear impaction. We'll admit to the hospital for concerns of abdominal pain with associated ileus. No evidence of acute need for surgical evaluation at this time, we'll treat with an enema and bowel rest. No evidence of small bowel obstruction by CT.  Discussed with Dr. Anne HahnWillis.  Sharyn CreamerMark Marrissa Dai, MD 10/09/15 [phone]

## 2015-10-09 NOTE — Progress Notes (Signed)
Did not give pt 4pm dose of lactulose. She had a very large loose bm.

## 2015-10-09 NOTE — ED Notes (Signed)
Floor called regarding delay in ready bed.  Per French Anaracy, floor is lacking telemetry box and nursing supervisor aware.

## 2015-10-09 NOTE — Care Management Important Message (Signed)
Important Message  Patient Details  Name: Cassandra Steele MRN: [id number] Date of Birth: 05/04/1936   Medicare Important Message Given:  Yes    [name] A, RN 10/09/2015, 11:27 AM

## 2015-10-09 NOTE — Clinical Social Work Note (Signed)
Clinical Social Work Assessment  Patient Details  Name: Cassandra Steele MRN: [id number] Date of Birth: 04/18/1936  Date of referral:  10/09/15               Reason for consult:   (From [name])                Permission sought to share information with:    Permission granted to share information::     Name::        Agency::     Relationship::     Contact Information:     Housing/Transportation Living arrangements for the past 2 months:  [facility] Source of Information:  Medical Team, Case Manager, Other (Comment Required) (Sister and niece) Patient Interpreter Needed:  None Criminal Activity/Legal Involvement Pertinent to Current Situation/Hospitalization:  No - Comment as needed Significant Relationships:  Siblings, Other Family Members, Community Support Lives with:  Facility Resident Do you feel safe going back to the place where you live?    Need for family participation in patient care:  Yes (Comment)  Care giving concerns:     Social Worker assessment / plan:  Visual merchandiserClinical Social Worker (CSW) consult, patient is from [name] followed by Hospice.   Patient was not alert or oriented..  (Information provided by her sister  Cassandra Steele [phone] and niece Cassandra Steele [phone] at bedside).  CSW introduced self and explained role of CSW department.  Informed family unable to provide any information on patient as they were not listed on her contacts.  Family understood.   [facility] where she has been for about 4 months.  Patient's support system are her two nieces and sister Cassandra Steele [phone] [place]. Per sister, patient does not have a HPOA but the sister is the next of kin.    CSW will continue to follow patient and assist with ongoing and discharge needs       Employment status:  Disabled (Comment on whether or not currently receiving Disability) Insurance information:  Medicare,  [place] Recommendations:  Not assessed at this time Information / Referral to community resources:   (none at this time under medical work up)  Patient/Family's Response to care:  Patient's family was appreciative of meeting with CSW.  Patient/Family's Understanding of and Emotional Response to Diagnosis, Current Treatment, and Prognosis:  Patient's family understands that patient is under continued medical work up at this time.    Emotional Assessment Appearance:  Appears stated age Attitude/Demeanor/Rapport:  Unable to Assess Affect (typically observed):  Unable to Assess Orientation:   (not alert or oriented at this time) Alcohol / Substance use:    Psych involvement (Current and /or in the community):  No (Comment)  Discharge Needs  Concerns to be addressed:  Discharge Planning Concerns, Care Coordination Readmission within the last 30 days:  Yes Current discharge risk:  Chronically ill, Dependent with Mobility, Physical Impairment Barriers to Discharge:  Continued Medical Work up   Soundra PilonMoore, Deborah H, LCSW 10/09/2015, 3:56 PM

## 2015-10-09 NOTE — Care Management Note (Signed)
Case Management Note  Patient Details  Name: Cassandra Steele MRN: [id number] Date of Birth: 05/03/1936  Subjective/Objective:     78yo Cassandra Steele [facility] to [name] with [facility] nursing services. Cassandra Lafayette DragonCarr was admi[facility] to [facility] on 10[facility]08[facility]15 with severe abdominal pain. Social Work Consult made for discharge planning.               Action/Plan:   Expected Discharge Date:                  Expected Discharge Plan:     In-House Referral:     Discharge planning Services     Post Acute Care Choice:    Choice offered to:     DME Arranged:    DME Agency:     HH Arranged:    HH Agency:     Status of Service:     Medicare Important Message Given:    Date Medicare IM Given:    Medicare IM give by:    Date Additional Medicare IM Given:    Additional Medicare Important Message give by:     If discussed at Long Length of Stay Meetings, dates discussed:    Additional Comments:  Keelon Zurn A, RN 10/09/2015, 9:46 AM

## 2015-10-09 NOTE — Progress Notes (Signed)
[facility] [place] [facility]   PATIENT NAME: Cassandra LoganFrances Quam    MR#:  [id number]  DATE OF BIRTH:  08/03/1936  SUBJECTIVE:   Non verbal due to advance dementai. Came in with abdominal pain and found to have constipation REVIEW OF SYSTEMS:   Review of Systems  Unable to perform ROS: dementia   Tolerating Diet:yes   DRUG ALLERGIES:  No Known Allergies  VITALS:  Blood pressure 116/102, pulse 93, temperature 98.4 F (36.9 C), temperature source Axillary, resp. rate 18, weight 147 lb 4.8 oz (66.815 kg), SpO2 100 %.  PHYSICAL EXAMINATION:   Physical Exam  GENERAL:  79 y.o.-year-old patient lying in the bed with no acute distress.  EYES: Pupils equal, round, reactive to light and accommodation. No scleral icterus. Extraocular muscles intact.  HEENT: Head atraumatic, normocephalic. Oropharynx and nasopharynx clear.  NECK:  Supple, no jugular venous distention. No thyroid enlargement, no tenderness.  LUNGS: Normal breath sounds bilaterally, no wheezing, rales, rhonchi. No use of accessory muscles of respiration.  CARDIOVASCULAR: S1, S2 normal. No murmurs, rubs, or gallops.  ABDOMEN: Soft, nontender, nondistended. Bowel sounds present. No organomegaly or mass.  EXTREMITIES: No cyanosis, clubbing or edema b/l.    NEUROLOGIC: unable to participate PSYCHIATRIC: The patient is alert  SKIN: No obvious rash, lesion, or ulcer.    LABORATORY PANEL:   CBC  Recent Labs Lab 10/09/15 0421  WBC 13.6*  HGB 10.2*  HCT 30.3*  PLT 169    Chemistries   Recent Labs Lab 10/08/15 1947 10/09/15 0421  NA 134* 134*  K 3.5 3.2*  CL 105 104  CO2 21* 23  GLUCOSE 138* 118*  BUN 14 14  CREATININE 0.82 0.83  CALCIUM 9.1 9.1  AST 30  --   ALT 35  --   ALKPHOS 39  --   BILITOT 0.9  --     Cardiac Enzymes  Recent Labs Lab 10/08/15 1947  TROPONINI <0.03    RADIOLOGY:  Ct Abdomen Pelvis W Contrast  10/08/2015  CLINICAL DATA:  Abdominal pain,  abdominal distension EXAM: CT ABDOMEN AND PELVIS WITH CONTRAST TECHNIQUE: Multidetector CT imaging of the abdomen and pelvis was performed using the standard protocol following bolus administration of intravenous contrast. CONTRAST:  100mL OMNIPAQUE IOHEXOL 300 MG/ML  SOLN COMPARISON:  10/19/2006 FINDINGS: Sagittal images of the spine shows mild degenerative changes thoracolumbar spine. Bilateral trace pleural effusion with bilateral basilar posterior atelectasis. Enhanced liver shows no focal mass. The pancreas spleen and adrenal glands are unremarkable. No aortic aneurysm. Small hiatal hernia. Enhanced kidneys are symmetrical in size. No hydronephrosis or hydroureter. Delayed renal images shows bilateral renal symmetrical excretion. Bilateral visualized proximal ureter is unremarkable. Abundant stool noted in right colon and hepatic flexure of the colon. Abundant gas noted within cecum. No pericecal inflammation. The terminal ileum is unremarkable. Moderate gas and stool noted within redundant transverse colon. Moderate gas noted within and descending colon. Colonic diverticula are noted in descending colon. There is no evidence of acute diverticulitis. Mild gaseous distended small bowel loops in mid abdomen probable mild ileus. No small bowel air-fluid levels. Again noted enlarged multi lobulated uterus with multiple calcified exophytic fibroids. The largest right fundal fibroid measures 5.7 cm. The largest left fundal calcified fibroid measures 3.4 cm. Exophytic left lower uterine segment fibroid measures 7.2 cm. The uterus measures 12 by 13.6 cm. The endometrial cavity is distorted. Moderate stool noted within rectum which is distended measures 7.1 cm in diameter suspicious for mild  fecal impaction. There is a Foley catheter within decompressed urinary bladder. IMPRESSION: 1. Small hiatal hernia. Bilateral small pleural effusion with bilateral basilar posterior atelectasis. 2. Abundant stool noted in right  colon and hepatic flexure of the colon. Moderate gas and stool noted within tortuous transverse colon. Descending colon diverticula are noted. No evidence of acute diverticulitis. 3. There is markedly enlarged multinodular uterus with multiple calcified exophytic fibroids. The uterus measures at least 12 x 13.6 cm. The largest calcified fibroids in left lower uterine segment measures 7.2 cm. 4. Moderate stool noted within rectum. Mild fecal impaction cannot be excluded. Mild gaseous distended small bowel loops probable mild ileus. No air-fluid levels are noted within small bowel. 5. No hydronephrosis or hydroureter. 6. There is a Foley catheter within decompressed urinary bladder Electronically Signed   By: Natasha Mead M.D.   On: 10/08/2015 21:59    ASSESSMENT AND PLAN:   *Ileus (HCC) - CLD - Cleanout significant stool burden and constipation with enema as below. We'll not place NG tube at this time.  * Abdominal pain - likely due to significant severe constipation and stool burden, treat as below.  * Constipation - enema ordered in the ED, per ED physician's exam patient did not have fecal impaction. We'll monitor for improvement after enema.  * Alzheimer's dementia - continue home meds  * HTN (hypertension) - at goal, continue home meds  *Hypothyroidism - continue home dose replacement  *ESBL UTI cont macrobid bid   Case discussed with Care Management/Social Worker. Management plans discussed with the patient, family and they are in agreement.  CODE STATUS: full  DVT Prophylaxis: lovenox  TOTAL TIME TAKING CARE OF THIS PATIENT: 25 minutes.  >50% time spent on counselling and coordination of care  POSSIBLE D/C IN *1 DAYS, DEPENDING ON CLINICAL CONDITION.   Ariyana Faw M.D on 10/09/2015 at 1:26 PM  Between 7am to 6pm - Pager - [phone]  After 6pm go to [email or web address] - password [email or web address] [facility]  [facility]  Office  [phone]  CC: Primary care physician;  Lyndon Code, MD

## 2015-10-09 NOTE — Progress Notes (Signed)
Pt. Non-verbal. Admission not completed.

## 2015-10-10 MED ORDER — BISACODYL 5 MG PO TBEC: 10.0000 mg | DELAYED_RELEASE_TABLET | Freq: Every day | ORAL | Status: DC | PRN

## 2015-10-10 MED ORDER — SENNOSIDES-DOCUSATE SODIUM 8.6-50 MG PO TABS: 1.0000 | ORAL_TABLET | Freq: Two times a day (BID) | ORAL | Status: DC

## 2015-10-10 NOTE — [place] FL2 (Signed)
[place] MEDICAID FL2 LEVEL OF CARE SCREENING TOOL     IDENTIFICATION  Patient Name: Cassandra LoganFrances Mawhinney Birthdate: 11/11/1936 Sex: female Admission Date (Current Location): 10/08/2015  [facility] [place]  [facility]  ([id number]) Facility and Address:  [facility], [address], [place], [place] [address]      Provider Number: [id number]  Attending Physician Name and Address:  Enedina FinnerSona Breniyah Romm, MD  Relative Name and Phone Number:       Current Level of Care: Hospital Recommended Level of Care: Assisted Living Facility, Memory Care Prior Approval Number:    Date Approved/Denied:   PASRR Number:  (  [id number] )  Discharge Plan: Other (Comment) (ALF (Memory Care) )    Current Diagnoses: Alzheimer's Dementia- Primary Patient Active Problem List   Diagnosis Date Noted  . Abdominal pain 10/08/2015  . Constipation 10/08/2015  . Ileus (HCC) 10/08/2015  . Alzheimer's dementia 10/08/2015  . HTN (hypertension) 10/08/2015  . Hypothyroidism 10/08/2015  . UTI (lower urinary tract infection)   . SIRS (systemic inflammatory response syndrome) (HCC) 10/03/2015  . Recurrent UTI 10/03/2015  . Sepsis (HCC) 04/18/2015  . Acute UTI 04/18/2015  . Delirium superimposed on dementia 04/18/2015  . HTN, goal below 140/90 04/18/2015    Orientation ACTIVITIES/SOCIAL BLADDER RESPIRATION    Self  Passive Incontinent Normal  BEHAVIORAL SYMPTOMS/MOOD NEUROLOGICAL BOWEL NUTRITION STATUS   (none)  (none) Incontinent Diet (Diet: Soft )  PHYSICIAN VISITS COMMUNICATION OF NEEDS Height & Weight Skin  30 days Non-Verbally 5\' 4"  (162.6 cm) 147 lbs. Normal          AMBULATORY STATUS RESPIRATION    Assist extensive Normal      Personal Care Assistance Level of Assistance  Bathing, Feeding, Dressing, Total care Bathing Assistance: Limited assistance Feeding assistance: Limited assistance Dressing Assistance: Limited assistance Total Care Assistance:  Limited assistance    Functional Limitations Info  Sight, Hearing, Speech Sight Info: Adequate Hearing Info: Adequate Speech Info: Adequate       SPECIAL CARE FACTORS FREQUENCY                      Additional Factors Info  Code Status, Isolation Precautions Code Status Info:  (Full Code. )       Isolation Precautions Info:  (ESBL on 10/03/15)     Current Medications (10/10/2015): Current Facility-Administered Medications  Medication Dose Route Frequency Provider Last Rate Last Dose  . acetaminophen (TYLENOL) tablet 650 mg  650 mg Oral Q6H PRN Oralia Manisavid Willis, MD       Or  . acetaminophen (TYLENOL) suppository 650 mg  650 mg Rectal Q6H PRN Oralia Manisavid Willis, MD   650 mg at 10/09/15 0230  . aspirin chewable tablet 81 mg  81 mg Oral Daily Oralia Manisavid Willis, MD   81 mg at 10/09/15 1007  . bisacodyl (DULCOLAX) EC tablet 10 mg  10 mg Oral Daily Enedina FinnerSona Kazi Reppond, MD   10 mg at 10/09/15 1008  . citalopram (CELEXA) tablet 40 mg  40 mg Oral QHS Oralia Manisavid Willis, MD   40 mg at 10/09/15 2145  . enoxaparin (LOVENOX) injection 40 mg  40 mg Subcutaneous Q24H Oralia Manisavid Willis, MD   40 mg at 10/10/15 0257  . levothyroxine (SYNTHROID, LEVOTHROID) tablet 75 mcg  75 mcg Oral QAC breakfast Oralia Manisavid Willis, MD   75 mcg at 10/10/15 [phone]  . nitrofurantoin (macrocrystal-monohydrate) (MACROBID) capsule 100 mg  100 mg Oral Q12H Enedina FinnerSona Lindsey Demonte, MD   100 mg at 10/09/15  2145  . ondansetron (ZOFRAN) tablet 4 mg  4 mg Oral Q6H PRN Oralia Manis, MD       Or  . ondansetron [facility]) injection 4 mg  4 mg Intravenous Q6H PRN Oralia Manis, MD      . senna-docusate (Senokot-S) tablet 1 tablet  1 tablet Oral BID Enedina Finner, MD   1 tablet at 10/09/15 2145  . sodium chloride 0.9 % injection 3 mL  3 mL Intravenous Q12H Oralia Manis, MD   3 mL at 10/09/15 2147   Do not use this list as official medication orders. Please verify with discharge summary.  Discharge Medications:   Medication List    ASK your doctor about these medications         acetaminophen 325 MG tablet  Commonly known as:  TYLENOL  Take 650 mg by mouth 2 (two) times daily as needed for mild pain or moderate pain.     aspirin 81 MG chewable tablet  Chew 81 mg by mouth daily.     Calcium Carbonate-Vitamin D 600-400 MG-UNIT tablet  Take 1 tablet by mouth 2 (two) times daily.     ciprofloxacin 500 MG tablet  Commonly known as:  CIPRO  Take 500 mg by mouth 2 (two) times daily.     citalopram 40 MG tablet  Commonly known as:  CELEXA  Take 40 mg by mouth at bedtime.     diclofenac sodium 1 % Gel  Commonly known as:  VOLTAREN  Apply 1 application topically 2 (two) times daily as needed (Apply to affected hips and knees twice a day as needed for joint pains.).     haloperidol 0.5 MG tablet  Commonly known as:  HALDOL  Take 0.5 mg by mouth See admin instructions. Take 1 tablet orally once a day and Take 1 tablet orally every 6 hours as needed for agitation.     hydrocortisone 2.5 % cream  Apply 1 application topically 2 (two) times daily as needed. For rash.     hydrOXYzine 25 MG tablet  Commonly known as:  ATARAX/VISTARIL  Take 25 mg by mouth every 6 (six) hours as needed for itching (or agitation.).     levothyroxine 75 MCG tablet  Commonly known as:  SYNTHROID, LEVOTHROID  Take 75 mcg by mouth daily.     LORazepam 0.5 MG tablet  Commonly known as:  ATIVAN  Take 0.5 mg by mouth every 8 (eight) hours as needed (for agitation.).     nitrofurantoin (macrocrystal-monohydrate) 100 MG capsule  Commonly known as:  MACROBID  Take 1 capsule (100 mg total) by mouth every 12 (twelve) hours.     pravastatin 10 MG tablet  Commonly known as:  PRAVACHOL  Take 10 mg by mouth at bedtime.     sulfamethoxazole-trimethoprim 800-160 MG tablet  Commonly known as:  BACTRIM DS,SEPTRA DS  Take 1 tablet by mouth 2 (two) times daily. Scheduled medication.     VESICARE 5 MG tablet  Generic drug:  solifenacin  Take 5 mg by mouth at bedtime.        Relevant  Imaging Results:  Relevant Lab Results:  Recent Labs    Additional Information  (Patient will be followed by [name]. SSN: [id number])  Haig Prophet, LCSW

## 2015-10-10 NOTE — Progress Notes (Signed)
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 [place] however they have not admitted her to hospice yet but they will when she returns to [facility]. [facility] administrator patient can return to [facility] when she is medically stable. Clinical Social Worker (CSW) will continue to follow and assist as needed.   Jetta LoutBailey Morgan, LCSWA [phone]

## 2015-10-10 NOTE — Care Management Important Message (Signed)
Important Message  Patient Details  Name: Cassandra LoganFrances Steele MRN: [id number] Date of Birth: 08/16/1936   Medicare Important Message Given:  Yes    Lunden Stieber A, RN 10/10/2015, 11:52 AM

## 2015-10-10 NOTE — NC FL2 (Addendum)
[place] MEDICAID FL2 LEVEL OF CARE SCREENING TOOL     IDENTIFICATION  Patient Name: Cassandra Steele Birthdate: 1936-07-28 Sex: female Admission Date (Current Location): 10/08/2015  [facility] and [place] Number:  [facility] )  ([id number]) Facility and Address:  [facility], [address] [place], [place] [address]      Provider Number: [id number]  Attending Physician Name and Address:  Enedina Finner, MD  Relative Name and Phone Number:       Current Level of Care: Hospital Recommended Level of Care: Assisted Living Facility, Memory Care Prior Approval Number:    Date Approved/Denied:   PASRR Number:  (  [id number] )  Discharge Plan: Other (Comment) (ALF (Memory Care) )    Current Diagnoses: Alzheimer's Dementia- Primary Patient Active Problem List   Diagnosis Date Noted  . Abdominal pain 10/08/2015  . Constipation 10/08/2015  . Ileus (HCC) 10/08/2015  . Alzheimer's dementia 10/08/2015  . HTN (hypertension) 10/08/2015  . Hypothyroidism 10/08/2015  . UTI (lower urinary tract infection)   . SIRS (systemic inflammatory response syndrome) (HCC) 10/03/2015  . Recurrent UTI 10/03/2015  . Sepsis (HCC) 04/18/2015  . Acute UTI 04/18/2015  . Delirium superimposed on dementia 04/18/2015  . HTN, goal below 140/90 04/18/2015    Orientation ACTIVITIES/SOCIAL BLADDER RESPIRATION    Self  Passive Incontinent Normal  BEHAVIORAL SYMPTOMS/MOOD NEUROLOGICAL BOWEL NUTRITION STATUS   (none)  (none) Incontinent Diet (Diet: Soft )  PHYSICIAN VISITS COMMUNICATION OF NEEDS Height & Weight Skin  30 days Non-Verbally  (162.6 cm) 147 lbs. Normal          AMBULATORY STATUS RESPIRATION    Assist extensive Normal      Personal Care Assistance Level of Assistance  Bathing, Feeding, Dressing, Total care Bathing Assistance: Limited assistance Feeding assistance: Limited assistance Dressing Assistance: Limited assistance Total Care Assistance:  Limited assistance    Functional Limitations Info  Sight, Hearing, Speech Sight Info: Adequate Hearing Info: Adequate Speech Info: Adequate       SPECIAL CARE FACTORS FREQUENCY                      Additional Factors Info  Code Status, Isolation Precautions Code Status Info:  (Full Code. )       Isolation Precautions Info:  (ESBL on 10/03/15)     Current Medications (10/10/2015): Current Facility-Administered Medications  Medication Dose Route Frequency Provider Last Rate Last Dose  . acetaminophen (TYLENOL) tablet 650 mg  650 mg Oral Q6H PRN Oralia Manis, MD       Or  . acetaminophen (TYLENOL) suppository 650 mg  650 mg Rectal Q6H PRN Oralia Manis, MD   650 mg at 10/09/15 0230  . aspirin chewable tablet 81 mg  81 mg Oral Daily Oralia Manis, MD   81 mg at 10/10/15 0947  . bisacodyl (DULCOLAX) EC tablet 10 mg  10 mg Oral Daily Enedina Finner, MD   10 mg at 10/09/15 1008  . citalopram (CELEXA) tablet 40 mg  40 mg Oral QHS Oralia Manis, MD   40 mg at 10/09/15 2145  . enoxaparin (LOVENOX) injection 40 mg  40 mg Subcutaneous Q24H Oralia Manis, MD   40 mg at 10/10/15 0257  . levothyroxine (SYNTHROID, LEVOTHROID) tablet 75 mcg  75 mcg Oral QAC breakfast Oralia Manis, MD   75 mcg at 10/10/15 [phone]  . nitrofurantoin (macrocrystal-monohydrate) (MACROBID) capsule 100 mg  100 mg Oral Q12H Enedina Finner, MD   100 mg at 10/10/15  [id number]  . ondansetron (ZOFRAN) tablet 4 mg  4 mg Oral Q6H PRN Oralia Manisavid Willis, MD       Or  . ondansetron [place](ZOFRAN) injection 4 mg  4 mg Intravenous Q6H PRN Oralia Manisavid Willis, MD      . senna-docusate (Senokot-S) tablet 1 tablet  1 tablet Oral BID Enedina FinnerSona Patel, MD   1 tablet at 10/10/15 0947  . sodium chloride 0.9 % injection 3 mL  3 mL Intravenous Q12H Oralia Manisavid Willis, MD   3 mL at 10/10/15 [phone]   Do not use this list as official medication orders. Please verify with discharge summary.  Discharge Medications:   Medication List    TAKE these medications        acetaminophen  325 MG tablet  Commonly known as:  TYLENOL  Take 650 mg by mouth 2 (two) times daily as needed for mild pain or moderate pain.     aspirin 81 MG chewable tablet  Chew 81 mg by mouth daily.     bisacodyl 5 MG EC tablet  Commonly known as:  DULCOLAX  Take 2 tablets (10 mg total) by mouth daily as needed for moderate constipation.     Calcium Carbonate-Vitamin D 600-400 MG-UNIT tablet  Take 1 tablet by mouth 2 (two) times daily.     ciprofloxacin 500 MG tablet  Commonly known as:  CIPRO  Take 500 mg by mouth 2 (two) times daily.     citalopram 40 MG tablet  Commonly known as:  CELEXA  Take 40 mg by mouth at bedtime.     diclofenac sodium 1 % Gel  Commonly known as:  VOLTAREN  Apply 1 application topically 2 (two) times daily as needed (Apply to affected hips and knees twice a day as needed for joint pains.).     haloperidol 0.5 MG tablet  Commonly known as:  HALDOL  Take 0.5 mg by mouth See admin instructions. Take 1 tablet orally once a day and Take 1 tablet orally every 6 hours as needed for agitation.     hydrocortisone 2.5 % cream  Apply 1 application topically 2 (two) times daily as needed. For rash.     hydrOXYzine 25 MG tablet  Commonly known as:  ATARAX/VISTARIL  Take 25 mg by mouth every 6 (six) hours as needed for itching (or agitation.).     levothyroxine 75 MCG tablet  Commonly known as:  SYNTHROID, LEVOTHROID  Take 75 mcg by mouth daily.     LORazepam 0.5 MG tablet  Commonly known as:  ATIVAN  Take 0.5 mg by mouth every 8 (eight) hours as needed (for agitation.).     nitrofurantoin (macrocrystal-monohydrate) 100 MG capsule  Commonly known as:  MACROBID  Take 1 capsule (100 mg total) by mouth every 12 (twelve) hours.     pravastatin 10 MG tablet  Commonly known as:  PRAVACHOL  Take 10 mg by mouth at bedtime.     senna-docusate 8.6-50 MG tablet  Commonly known as:  Senokot-S  Take 1 tablet by mouth 2 (two) times daily.     sulfamethoxazole-trimethoprim  800-160 MG tablet  Commonly known as:  BACTRIM DS,SEPTRA DS  Take 1 tablet by mouth 2 (two) times daily. Scheduled medication.     VESICARE 5 MG tablet  Generic drug:  solifenacin  Take 5 mg by mouth at bedtime.        Relevant Imaging Results:  Relevant Lab Results:  Recent Labs    Additional Information  ([facility] [name] SSN: [id number])  Haig Prophet, LCSW

## 2015-10-10 NOTE — Progress Notes (Signed)
[facility] [place] [facility]   PATIENT NAME: Cassandra LoganFrances Steele    MR#:  [id number]  DATE OF BIRTH:  04/02/1936  SUBJECTIVE:   Non verbal due to advance dementai. Came in with abdominal pain and found to have constipation Had several moderate to large BM's y'day. No other issues per RN REVIEW OF SYSTEMS:   Review of Systems  Unable to perform ROS: dementia   Tolerating Diet:yes   DRUG ALLERGIES:  No Known Allergies  VITALS:  Blood pressure 114/68, pulse 86, temperature 98.1 F (36.7 C), temperature source Axillary, resp. rate 18, weight 147 lb 4.8 oz (66.815 kg), SpO2 100 %.  PHYSICAL EXAMINATION:   Physical Exam  GENERAL:  79 y.o.-year-old patient lying in the bed with no acute distress.  EYES: Pupils equal, round, reactive to light and accommodation. No scleral icterus. Extraocular muscles intact.  HEENT: Head atraumatic, normocephalic. Oropharynx and nasopharynx clear.  NECK:  Supple, no jugular venous distention. No thyroid enlargement, no tenderness.  LUNGS: Normal breath sounds bilaterally, no wheezing, rales, rhonchi. No use of accessory muscles of respiration.  CARDIOVASCULAR: S1, S2 normal. No murmurs, rubs, or gallops.  ABDOMEN: Soft, nontender, nondistended. Bowel sounds present. No organomegaly or mass.  EXTREMITIES: No cyanosis, clubbing or edema b/l.    NEUROLOGIC: unable to participate PSYCHIATRIC: The patient is alert  SKIN: No obvious rash, lesion, or ulcer.    LABORATORY PANEL:   CBC  Recent Labs Lab 10/09/15 0421  WBC 13.6*  HGB 10.2*  HCT 30.3*  PLT 169    Chemistries   Recent Labs Lab 10/08/15 1947 10/09/15 0421  NA 134* 134*  K 3.5 3.2*  CL 105 104  CO2 21* 23  GLUCOSE 138* 118*  BUN 14 14  CREATININE 0.82 0.83  CALCIUM 9.1 9.1  AST 30  --   ALT 35  --   ALKPHOS 39  --   BILITOT 0.9  --     Cardiac Enzymes  Recent Labs Lab 10/08/15 1947  TROPONINI <0.03    RADIOLOGY:  Ct Abdomen Pelvis  W Contrast  10/08/2015  CLINICAL DATA:  Abdominal pain, abdominal distension EXAM: CT ABDOMEN AND PELVIS WITH CONTRAST TECHNIQUE: Multidetector CT imaging of the abdomen and pelvis was performed using the standard protocol following bolus administration of intravenous contrast. CONTRAST:  100mL OMNIPAQUE IOHEXOL 300 MG/ML  SOLN COMPARISON:  10/19/2006 FINDINGS: Sagittal images of the spine shows mild degenerative changes thoracolumbar spine. Bilateral trace pleural effusion with bilateral basilar posterior atelectasis. Enhanced liver shows no focal mass. The pancreas spleen and adrenal glands are unremarkable. No aortic aneurysm. Small hiatal hernia. Enhanced kidneys are symmetrical in size. No hydronephrosis or hydroureter. Delayed renal images shows bilateral renal symmetrical excretion. Bilateral visualized proximal ureter is unremarkable. Abundant stool noted in right colon and hepatic flexure of the colon. Abundant gas noted within cecum. No pericecal inflammation. The terminal ileum is unremarkable. Moderate gas and stool noted within redundant transverse colon. Moderate gas noted within and descending colon. Colonic diverticula are noted in descending colon. There is no evidence of acute diverticulitis. Mild gaseous distended small bowel loops in mid abdomen probable mild ileus. No small bowel air-fluid levels. Again noted enlarged multi lobulated uterus with multiple calcified exophytic fibroids. The largest right fundal fibroid measures 5.7 cm. The largest left fundal calcified fibroid measures 3.4 cm. Exophytic left lower uterine segment fibroid measures 7.2 cm. The uterus measures 12 by 13.6 cm. The endometrial cavity is distorted. Moderate stool noted within  rectum which is distended measures 7.1 cm in diameter suspicious for mild fecal impaction. There is a Foley catheter within decompressed urinary bladder. IMPRESSION: 1. Small hiatal hernia. Bilateral small pleural effusion with bilateral basilar  posterior atelectasis. 2. Abundant stool noted in right colon and hepatic flexure of the colon. Moderate gas and stool noted within tortuous transverse colon. Descending colon diverticula are noted. No evidence of acute diverticulitis. 3. There is markedly enlarged multinodular uterus with multiple calcified exophytic fibroids. The uterus measures at least 12 x 13.6 cm. The largest calcified fibroids in left lower uterine segment measures 7.2 cm. 4. Moderate stool noted within rectum. Mild fecal impaction cannot be excluded. Mild gaseous distended small bowel loops probable mild ileus. No air-fluid levels are noted within small bowel. 5. No hydronephrosis or hydroureter. 6. There is a Foley catheter within decompressed urinary bladder Electronically Signed   By: Natasha Mead M.D.   On: 10/08/2015 21:59    ASSESSMENT AND PLAN:   *Ileus (HCC) - due to severe constipation - Cleaned out significant stool burden. Doing well. Pt had several mod-large BM's after prep y'day  * Abdominal pain - likely due to significant severe constipation and stool burden-resolved  * Alzheimer's dementia - continue home meds  * HTN (hypertension) - at goal, continue home meds  *Hypothyroidism - continue home dose replacement  *ESBL UTI cont macrobid bid D/c in am  Case discussed with Care Management/Social Worker. CODE STATUS: full  DVT Prophylaxis: lovenox  TOTAL TIME TAKING CARE OF THIS PATIENT: 25 minutes.  >50% time spent on counselling and coordination of care  POSSIBLE D/C IN Am DAYS, DEPENDING ON CLINICAL CONDITION.   Nicolas Banh M.D on 10/10/2015 at 7:41 AM  Between 7am to 6pm - Pager - [phone]  After 6pm go to [email or web address] - password [email or web address] [facility]  [facility]  Office  [phone]  CC: Primary care physician; Lyndon Code, MD

## 2015-10-10 NOTE — Progress Notes (Signed)
Patient being discharged to [name]. IV's removed & belongings packed. EMS called.

## 2015-10-10 NOTE — Discharge Summary (Signed)
[facility] [place] [facility]   PATIENT NAME: Cassandra LoganFrances Prout    MR#:  [id number]  DATE OF BIRTH:  05/31/1936  DATE OF ADMISSION:  10/08/2015 ADMITTING PHYSICIAN: Oralia Manisavid Willis, MD  DATE OF DISCHARGE: 10/10/15  PRIMARY CARE PHYSICIAN: Lyndon CodeKHAN, FOZIA M, MD    ADMISSION DIAGNOSIS:  Ileus (HCC) [K56.7] Abdominal pain, unspecified abdominal location [R10.9]  DISCHARGE DIAGNOSIS:  Severe constipation  SECONDARY DIAGNOSIS:   Past Medical History  Diagnosis Date  . Hypertension   . Dementia   . Alzheimer's disease   . Thyroid disease   . Anemia   . Osteoarthritis   . Depression   . Mixed incontinence     HOSPITAL COURSE:   Ileus (HCC) - due to severe constipation - Cleaned out significant stool burden. Doing well. Pt had several mod-large BM's after prep y'day  * Abdominal pain - likely due to significant severe constipation and stool burden-resolved  * Alzheimer's dementia - continue home meds  * HTN (hypertension) - at goal, continue home meds  *Hypothyroidism - continue home dose replacement  *ESBL UTI cont macrobid bid D/c today  CONSULTS OBTAINED:    none DRUG ALLERGIES:  No Known Allergies  DISCHARGE MEDICATIONS:   Current Discharge Medication List    START taking these medications   Details  bisacodyl (DULCOLAX) 5 MG EC tablet Take 2 tablets (10 mg total) by mouth daily as needed for moderate constipation. Qty: 30 tablet, Refills: 0    senna-docusate (SENOKOT-S) 8.6-50 MG tablet Take 1 tablet by mouth 2 (two) times daily. Qty: 60 tablet, Refills: 0      CONTINUE these medications which have NOT CHANGED   Details  acetaminophen (TYLENOL) 325 MG tablet Take 650 mg by mouth 2 (two) times daily as needed for mild pain or moderate pain.     aspirin 81 MG chewable tablet Chew 81 mg by mouth daily.    Calcium Carbonate-Vitamin D 600-400 MG-UNIT per tablet Take 1 tablet by mouth 2 (two) times daily.    ciprofloxacin (CIPRO)  500 MG tablet Take 500 mg by mouth 2 (two) times daily.    citalopram (CELEXA) 40 MG tablet Take 40 mg by mouth at bedtime.    diclofenac sodium (VOLTAREN) 1 % GEL Apply 1 application topically 2 (two) times daily as needed (Apply to affected hips and knees twice a day as needed for joint pains.).     haloperidol (HALDOL) 0.5 MG tablet Take 0.5 mg by mouth See admin instructions. Take 1 tablet orally once a day and Take 1 tablet orally every 6 hours as needed for agitation.    hydrocortisone 2.5 % cream Apply 1 application topically 2 (two) times daily as needed. For rash.    hydrOXYzine (ATARAX/VISTARIL) 25 MG tablet Take 25 mg by mouth every 6 (six) hours as needed for itching (or agitation.).    levothyroxine (SYNTHROID, LEVOTHROID) 75 MCG tablet Take 75 mcg by mouth daily.    LORazepam (ATIVAN) 0.5 MG tablet Take 0.5 mg by mouth every 8 (eight) hours as needed (for agitation.).     nitrofurantoin, macrocrystal-monohydrate, (MACROBID) 100 MG capsule Take 1 capsule (100 mg total) by mouth every 12 (twelve) hours. Qty: 20 capsule, Refills: 0    pravastatin (PRAVACHOL) 10 MG tablet Take 10 mg by mouth at bedtime.    sulfamethoxazole-trimethoprim (BACTRIM DS,SEPTRA DS) 800-160 MG tablet Take 1 tablet by mouth 2 (two) times daily. Scheduled medication.    VESICARE 5 MG tablet Take 5 mg  by mouth at bedtime.         If you experience worsening of your admission symptoms, develop shortness of breath, life threatening emergency, suicidal or homicidal thoughts you must seek medical attention immediately by calling 911 or calling your MD immediately  if symptoms less severe.  You Must read complete instructions/literature along with all the possible adverse reactions/side effects for all the Medicines you take and that have been prescribed to you. Take any new Medicines after you have completely understood and accept all the possible adverse reactions/side effects.   Please note  You were  cared for by a hospitalist during your hospital stay. If you have any questions about your discharge medications or the care you received while you were in the hospital after you are discharged, you can call the unit and asked to speak with the hospitalist on call if the hospitalist that took care of you is not available. Once you are discharged, your primary care physician will handle any further medical issues. Please note that NO REFILLS for any discharge medications will be authorized once you are discharged, as it is imperative that you return to your primary care physician (or establish a relationship with a primary care physician if you do not have one) for your aftercare needs so that they can reassess your need for medications and monitor your lab values. CBC   Recent Labs Lab 10/09/15 0421  WBC 13.6*  HGB 10.2*  HCT 30.3*  PLT 169    Chemistries   Recent Labs Lab 10/08/15 1947 10/09/15 0421  NA 134* 134*  K 3.5 3.2*  CL 105 104  CO2 21* 23  GLUCOSE 138* 118*  BUN 14 14  CREATININE 0.82 0.83  CALCIUM 9.1 9.1  AST 30  --   ALT 35  --   ALKPHOS 39  --   BILITOT 0.9  --     Microbiology Results   Recent Results (from the past 240 hour(s))  Urine culture     Status: None (Preliminary result)   Collection Time: 10/03/15  6:35 PM  Result Value Ref Range Status   Specimen Description URINE, RANDOM  Final   Special Requests NONE  Final   Culture   Final    >=100,000 COLONIES/mL ESCHERICHIA COLI Susceptibility Pattern Suggests Possibility of an Extended Spectrum Beta Lactamase Producer. Contact Laboratory Within 7 Days if Confirmation Warranted. Results Called to: RN KELLY HUBACHECK 10/06/15 0830AM MLM SENDING TO [facility] FOR FOSFOMYCIN PER DR. FITZGERALD    Report Status PENDING  Incomplete   Organism ID, Bacteria ESCHERICHIA COLI  Final      Susceptibility   Escherichia coli - MIC*    AMPICILLIN >=32 RESISTANT Resistant     CEFTAZIDIME 4 RESISTANT Resistant      CEFAZOLIN >=64 RESISTANT Resistant     CEFTRIAXONE >=64 RESISTANT Resistant     CIPROFLOXACIN >=4 RESISTANT Resistant     GENTAMICIN <=1 SENSITIVE Sensitive     IMIPENEM <=0.25 SENSITIVE Sensitive     TRIMETH/SULFA >=320 RESISTANT Resistant     Extended ESBL POSITIVE Resistant     PIP/TAZO Value in next row Sensitive      SENSITIVE<=4    * >=100,000 COLONIES/mL ESCHERICHIA COLI  Blood Culture (routine x 2)     Status: None   Collection Time: 10/03/15  6:37 PM  Result Value Ref Range Status   Specimen Description BLOOD RIGHT AC  Final   Special Requests BOTTLES DRAWN AEROBIC AND ANAEROBIC  1CC  Final  Culture NO GROWTH 5 DAYS  Final   Report Status 10/08/2015 FINAL  Final  Blood Culture (routine x 2)     Status: None   Collection Time: 10/03/15  6:37 PM  Result Value Ref Range Status   Specimen Description BLOOD LEFT AC  Final   Special Requests BOTTLES DRAWN AEROBIC AND ANAEROBIC  1CC  Final   Culture NO GROWTH 5 DAYS  Final   Report Status 10/08/2015 FINAL  Final  MRSA PCR Screening     Status: None   Collection Time: 10/04/15 12:02 AM  Result Value Ref Range Status   MRSA by PCR NEGATIVE NEGATIVE Final    Comment:        The GeneXpert MRSA Assay (FDA approved for NASAL specimens only), is one component of a comprehensive MRSA colonization surveillance program. It is not intended to diagnose MRSA infection nor to guide or monitor treatment for MRSA infections.   Culture, blood (routine x 2)     Status: None (Preliminary result)   Collection Time: 10/08/15  7:47 PM  Result Value Ref Range Status   Specimen Description BLOOD LEFT WRIST  Final   Special Requests BOTTLES DRAWN AEROBIC AND ANAEROBIC 4CC  Final   Culture NO GROWTH 2 DAYS  Final   Report Status PENDING  Incomplete  Culture, blood (routine x 2)     Status: None (Preliminary result)   Collection Time: 10/08/15  7:47 PM  Result Value Ref Range Status   Specimen Description BLOOD RIGHT ARM  Final   Special  Requests BOTTLES DRAWN AEROBIC AND ANAEROBIC 4CC  Final   Culture NO GROWTH 2 DAYS  Final   Report Status PENDING  Incomplete    RADIOLOGY:  Ct Abdomen Pelvis W Contrast  10/08/2015  CLINICAL DATA:  Abdominal pain, abdominal distension EXAM: CT ABDOMEN AND PELVIS WITH CONTRAST TECHNIQUE: Multidetector CT imaging of the abdomen and pelvis was performed using the standard protocol following bolus administration of intravenous contrast. CONTRAST:  OMNIPAQUE IOHEXOL 300 MG/ML  SOLN COMPARISON:  10/19/2006 FINDINGS: Sagittal images of the spine shows mild degenerative changes thoracolumbar spine. Bilateral trace pleural effusion with bilateral basilar posterior atelectasis. Enhanced liver shows no focal mass. The pancreas spleen and adrenal glands are unremarkable. No aortic aneurysm. Small hiatal hernia. Enhanced kidneys are symmetrical in size. No hydronephrosis or hydroureter. Delayed renal images shows bilateral renal symmetrical excretion. Bilateral visualized proximal ureter is unremarkable. Abundant stool noted in right colon and hepatic flexure of the colon. Abundant gas noted within cecum. No pericecal inflammation. The terminal ileum is unremarkable. Moderate gas and stool noted within redundant transverse colon. Moderate gas noted within and descending colon. Colonic diverticula are noted in descending colon. There is no evidence of acute diverticulitis. Mild gaseous distended small bowel loops in mid abdomen probable mild ileus. No small bowel air-fluid levels. Again noted enlarged multi lobulated uterus with multiple calcified exophytic fibroids. The largest right fundal fibroid measures 5.7 cm. The largest left fundal calcified fibroid measures 3.4 cm. Exophytic left lower uterine segment fibroid measures 7.2 cm. The uterus measures 12 by 13.6 cm. The endometrial cavity is distorted. Moderate stool noted within rectum which is distended measures 7.1 cm in diameter suspicious for mild fecal  impaction. There is a Foley catheter within decompressed urinary bladder. IMPRESSION: 1. Small hiatal hernia. Bilateral small pleural effusion with bilateral basilar posterior atelectasis. 2. Abundant stool noted in right colon and hepatic flexure of the colon. Moderate gas and stool noted within tortuous transverse  colon. Descending colon diverticula are noted. No evidence of acute diverticulitis. 3. There is markedly enlarged multinodular uterus with multiple calcified exophytic fibroids. The uterus measures at least 12 x 13.6 cm. The largest calcified fibroids in left lower uterine segment measures 7.2 cm. 4. Moderate stool noted within rectum. Mild fecal impaction cannot be excluded. Mild gaseous distended small bowel loops probable mild ileus. No air-fluid levels are noted within small bowel. 5. No hydronephrosis or hydroureter. 6. There is a Foley catheter within decompressed urinary bladder Electronically Signed   By: Natasha Mead M.D.   On: 10/08/2015 21:59     Management plans discussed with the patient, family and they are in agreement.  CODE STATUS:     Code Status Orders        Start     Ordered   10/09/15 0118  Full code   Continuous     10/09/15 0117      TOTAL TIME TAKING CARE OF THIS PATIENT: 40 minutes.    Cobie Marcoux M.D on 10/10/2015 at 1:07 PM  Between 7am to 6pm - Pager - [phone] After 6pm go to [email or web address] - password [email or web address] [facility]  [facility]  Office  [phone]  CC: Primary care physician; Lyndon Code, MD

## 2015-10-10 NOTE — Progress Notes (Signed)
Patient is medially stable for D/C back to [facility]. Clinical Child psychotherapistocial Worker (CSW) faxed D/C Summary and FL2 [facility]. [facility]. CSW contacted patient's sister [name] and made her aware of above. Please reconsult if future social work needs arise. CSW signing off.   Jetta LoutBailey Morgan, LCSWA [phone]

## 2015-10-12 LAB — SUSCEPTIBILITY, AER + ANAEROB

## 2015-10-13 LAB — CULTURE, BLOOD (ROUTINE X 2)
CULTURE: NO GROWTH
Culture: NO GROWTH

## 2015-11-11 ENCOUNTER — Encounter: Payer: Self-pay | Admitting: Internal Medicine

## 2015-11-11 LAB — SUSCEPTIBILITY, AER + ANAEROB

## 2015-11-12 LAB — URINE CULTURE

## 2016-02-18 ENCOUNTER — Inpatient Hospital Stay
Admission: EM | Admit: 2016-02-18 | Discharge: 2016-02-21 | DRG: 481 | Disposition: A | Discharge status: Skilled Nursing Facility | Attending: Internal Medicine | Admitting: Internal Medicine

## 2016-02-18 ENCOUNTER — Emergency Department

## 2016-02-18 ENCOUNTER — Encounter: Payer: Self-pay | Admitting: *Deleted

## 2016-02-18 DIAGNOSIS — Z79899 Other long term (current) drug therapy: Secondary | ICD-10-CM | POA: Diagnosis not present

## 2016-02-18 DIAGNOSIS — Z7982 Long term (current) use of aspirin: Secondary | ICD-10-CM | POA: Diagnosis not present

## 2016-02-18 DIAGNOSIS — F329 Major depressive disorder, single episode, unspecified: Secondary | ICD-10-CM | POA: Diagnosis present

## 2016-02-18 DIAGNOSIS — L89620 Pressure ulcer of left heel, unstageable: Secondary | ICD-10-CM | POA: Diagnosis present

## 2016-02-18 DIAGNOSIS — Z66 Do not resuscitate: Secondary | ICD-10-CM | POA: Diagnosis present

## 2016-02-18 DIAGNOSIS — L8990 Pressure ulcer of unspecified site, unspecified stage: Secondary | ICD-10-CM | POA: Insufficient documentation

## 2016-02-18 DIAGNOSIS — Z419 Encounter for procedure for purposes other than remedying health state, unspecified: Secondary | ICD-10-CM

## 2016-02-18 DIAGNOSIS — W050XXA Fall from non-moving wheelchair, initial encounter: Secondary | ICD-10-CM | POA: Diagnosis present

## 2016-02-18 DIAGNOSIS — G309 Alzheimer's disease, unspecified: Secondary | ICD-10-CM | POA: Diagnosis present

## 2016-02-18 DIAGNOSIS — S72141A Displaced intertrochanteric fracture of right femur, initial encounter for closed fracture: Secondary | ICD-10-CM | POA: Diagnosis present

## 2016-02-18 DIAGNOSIS — I10 Essential (primary) hypertension: Secondary | ICD-10-CM | POA: Diagnosis present

## 2016-02-18 DIAGNOSIS — E039 Hypothyroidism, unspecified: Secondary | ICD-10-CM | POA: Diagnosis present

## 2016-02-18 DIAGNOSIS — F0280 Dementia in other diseases classified elsewhere without behavioral disturbance: Secondary | ICD-10-CM | POA: Diagnosis present

## 2016-02-18 DIAGNOSIS — S72143A Displaced intertrochanteric fracture of unspecified femur, initial encounter for closed fracture: Secondary | ICD-10-CM | POA: Diagnosis present

## 2016-02-18 DIAGNOSIS — B961 Klebsiella pneumoniae [K. pneumoniae] as the cause of diseases classified elsewhere: Secondary | ICD-10-CM | POA: Diagnosis present

## 2016-02-18 DIAGNOSIS — M1990 Unspecified osteoarthritis, unspecified site: Secondary | ICD-10-CM | POA: Diagnosis present

## 2016-02-18 DIAGNOSIS — Y92129 Unspecified place in nursing home as the place of occurrence of the external cause: Secondary | ICD-10-CM

## 2016-02-18 DIAGNOSIS — E785 Hyperlipidemia, unspecified: Secondary | ICD-10-CM | POA: Diagnosis present

## 2016-02-18 DIAGNOSIS — N3000 Acute cystitis without hematuria: Secondary | ICD-10-CM | POA: Diagnosis present

## 2016-02-18 DIAGNOSIS — M25551 Pain in right hip: Secondary | ICD-10-CM | POA: Diagnosis present

## 2016-02-18 DIAGNOSIS — S7291XA Unspecified fracture of right femur, initial encounter for closed fracture: Secondary | ICD-10-CM

## 2016-02-18 DIAGNOSIS — S72001A Fracture of unspecified part of neck of right femur, initial encounter for closed fracture: Secondary | ICD-10-CM

## 2016-02-18 LAB — CBC WITH DIFFERENTIAL/PLATELET
BASOS ABS: 0.1 10*3/uL (ref 0–0.1)
BASOS PCT: 1 %
EOS ABS: 0.2 10*3/uL (ref 0–0.7)
Eosinophils Relative: 2 %
HCT: 34.4 % — ABNORMAL LOW (ref 35.0–47.0)
Hemoglobin: 12 g/dL (ref 12.0–16.0)
LYMPHS PCT: 21 %
Lymphs Abs: 2.2 10*3/uL (ref 1.0–3.6)
MCH: 31.1 pg (ref 26.0–34.0)
MCHC: 35 g/dL (ref 32.0–36.0)
MCV: 88.8 fL (ref 80.0–100.0)
MONO ABS: 0.8 10*3/uL (ref 0.2–0.9)
Monocytes Relative: 8 %
Neutro Abs: 7.3 10*3/uL — ABNORMAL HIGH (ref 1.4–6.5)
Neutrophils Relative %: 70 %
PLATELETS: 177 10*3/uL (ref 150–440)
RBC: 3.87 MIL/uL (ref 3.80–5.20)
RDW: 13.2 % (ref 11.5–14.5)
WBC: 10.4 10*3/uL (ref 3.6–11.0)

## 2016-02-18 LAB — URINALYSIS COMPLETE WITH MICROSCOPIC (ARMC ONLY)
BILIRUBIN URINE: NEGATIVE
GLUCOSE, UA: 150 mg/dL — AB
HGB URINE DIPSTICK: NEGATIVE
KETONES UR: NEGATIVE mg/dL
NITRITE: POSITIVE — AB
PH: 5 (ref 5.0–8.0)
Protein, ur: NEGATIVE mg/dL
Specific Gravity, Urine: 1.012 (ref 1.005–1.030)

## 2016-02-18 LAB — PROTIME-INR
INR: 1.15
PROTHROMBIN TIME: 14.9 s (ref 11.4–15.0)

## 2016-02-18 LAB — TYPE AND SCREEN
ABO/RH(D): AB POS
ANTIBODY SCREEN: NEGATIVE

## 2016-02-18 LAB — BASIC METABOLIC PANEL
ANION GAP: 9 (ref 5–15)
BUN: 14 mg/dL (ref 6–20)
CALCIUM: 9.5 mg/dL (ref 8.9–10.3)
CO2: 19 mmol/L — ABNORMAL LOW (ref 22–32)
Chloride: 107 mmol/L (ref 101–111)
Creatinine, Ser: 0.74 mg/dL (ref 0.44–1.00)
Glucose, Bld: 228 mg/dL — ABNORMAL HIGH (ref 65–99)
POTASSIUM: 3.8 mmol/L (ref 3.5–5.1)
SODIUM: 135 mmol/L (ref 135–145)

## 2016-02-18 LAB — APTT: aPTT: 33 seconds (ref 24–36)

## 2016-02-18 LAB — ALBUMIN: Albumin: 3.6 g/dL (ref 3.5–5.0)

## 2016-02-18 MED ORDER — ACETAMINOPHEN 650 MG RE SUPP
650.0000 mg | Freq: Four times a day (QID) | RECTAL | Status: DC | PRN
Start: 2016-02-18 — End: 2016-02-19

## 2016-02-18 MED ORDER — LEVOTHYROXINE SODIUM 75 MCG PO TABS
75.0000 ug | ORAL_TABLET | Freq: Every day | ORAL | Status: DC
  Filled 2016-02-18: qty 1

## 2016-02-18 MED ORDER — MORPHINE SULFATE (PF) 2 MG/ML IV SOLN: 2.0000 mg | INTRAVENOUS | Status: DC | PRN

## 2016-02-18 MED ORDER — FENTANYL CITRATE (PF) 100 MCG/2ML IJ SOLN: 50.0000 ug | Freq: Once | INTRAMUSCULAR | Status: DC

## 2016-02-18 MED ORDER — SENNA 8.6 MG PO TABS
2.0000 | ORAL_TABLET | Freq: Two times a day (BID) | ORAL | Status: DC
  Administered 2016-02-18 – 2016-02-21 (×4): 17.2 mg via ORAL
  Filled 2016-02-18 (×4): qty 2

## 2016-02-18 MED ORDER — HYDROXYZINE HCL 25 MG PO TABS: 25.0000 mg | ORAL_TABLET | Freq: Four times a day (QID) | ORAL | Status: DC | PRN

## 2016-02-18 MED ORDER — ACETAMINOPHEN 325 MG PO TABS: 650.0000 mg | ORAL_TABLET | Freq: Four times a day (QID) | ORAL | Status: DC | PRN

## 2016-02-18 MED ORDER — LORAZEPAM 0.5 MG PO TABS: 0.5000 mg | ORAL_TABLET | Freq: Three times a day (TID) | ORAL | Status: DC | PRN

## 2016-02-18 MED ORDER — SODIUM CHLORIDE 0.9% FLUSH
3.0000 mL | Freq: Two times a day (BID) | INTRAVENOUS | Status: DC
  Administered 2016-02-21: 3 mL via INTRAVENOUS

## 2016-02-18 MED ORDER — ONDANSETRON HCL 4 MG PO TABS: 4.0000 mg | ORAL_TABLET | Freq: Four times a day (QID) | ORAL | Status: DC | PRN

## 2016-02-18 MED ORDER — BISACODYL 10 MG RE SUPP: 10.0000 mg | RECTAL | Status: DC | PRN

## 2016-02-18 MED ORDER — FENTANYL CITRATE (PF) 100 MCG/2ML IJ SOLN
25.0000 ug | Freq: Once | INTRAMUSCULAR | Status: DC
Start: 2016-02-18 — End: 2016-02-18

## 2016-02-18 MED ORDER — MORPHINE SULFATE (PF) 4 MG/ML IV SOLN
4.0000 mg | INTRAVENOUS | Status: DC | PRN
Start: 2016-02-18 — End: 2016-02-19
  Administered 2016-02-19: 4 mg via INTRAVENOUS
  Filled 2016-02-18: qty 1

## 2016-02-18 MED ORDER — OXYCODONE HCL 5 MG PO TABS: 5.0000 mg | ORAL_TABLET | ORAL | Status: DC | PRN

## 2016-02-18 MED ORDER — PRAVASTATIN SODIUM 20 MG PO TABS
10.0000 mg | ORAL_TABLET | Freq: Every day | ORAL | Status: DC
  Administered 2016-02-18 – 2016-02-19 (×2): 10 mg via ORAL
  Filled 2016-02-18 (×2): qty 1

## 2016-02-18 MED ORDER — ONDANSETRON HCL 4 MG/2ML IJ SOLN: 4.0000 mg | Freq: Four times a day (QID) | INTRAMUSCULAR | Status: DC | PRN

## 2016-02-18 MED ORDER — ASPIRIN 81 MG PO CHEW
81.0000 mg | CHEWABLE_TABLET | Freq: Every day | ORAL | Status: DC
  Administered 2016-02-20 – 2016-02-21 (×2): 81 mg via ORAL
  Filled 2016-02-18 (×2): qty 1

## 2016-02-18 MED ORDER — FENTANYL CITRATE (PF) 100 MCG/2ML IJ SOLN
50.0000 ug | Freq: Once | INTRAMUSCULAR | Status: AC
  Administered 2016-02-18: 50 ug via INTRAVENOUS
  Filled 2016-02-18: qty 2

## 2016-02-18 MED ORDER — SODIUM CHLORIDE 0.9 % IV SOLN
INTRAVENOUS | Status: DC
  Administered 2016-02-18: 23:00:00 via INTRAVENOUS

## 2016-02-18 MED ORDER — SERTRALINE HCL 50 MG PO TABS
50.0000 mg | ORAL_TABLET | Freq: Every day | ORAL | Status: DC
  Administered 2016-02-20 – 2016-02-21 (×2): 50 mg via ORAL
  Filled 2016-02-18 (×2): qty 1

## 2016-02-18 NOTE — ED Provider Notes (Addendum)
[facility] Emergency Department Provider Note  ____________________________________________   I have reviewed the triage vital signs and the nursing notes.   HISTORY  Chief Complaint Fall and Hip Pain    HPI Cassandra Steele is a 80 y.o. female who cannot give a history secondary dementia, level V chart caveat. Patient here because of a fall yesterday. Slept and fell out of her wheelchair carted EMS. Not ambulatory. Has end-stage Alzheimer's, does not normally ambulate. Is a hospice patient according to Liborio Nixon at the assisted living facility.      Past Medical History  Diagnosis Date  . Hypertension   . Dementia   . Alzheimer's disease   . Thyroid disease   . Anemia   . Osteoarthritis   . Depression   . Mixed incontinence     Patient Active Problem List   Diagnosis Date Noted  . Abdominal pain 10/08/2015  . Constipation 10/08/2015  . Ileus (HCC) 10/08/2015  . Alzheimer's dementia 10/08/2015  . HTN (hypertension) 10/08/2015  . Hypothyroidism 10/08/2015  . UTI (lower urinary tract infection)   . SIRS (systemic inflammatory response syndrome) (HCC) 10/03/2015  . Recurrent UTI 10/03/2015  . Sepsis (HCC) 04/18/2015  . Acute UTI 04/18/2015  . Delirium superimposed on dementia 04/18/2015  . HTN, goal below 140/90 04/18/2015    Past Surgical History  Procedure Laterality Date  . No past surgeries      Current Outpatient Rx  Name  Route  Sig  Dispense  Refill  . acetaminophen (TYLENOL) 325 MG tablet   Oral   Take 650 mg by mouth 2 (two) times daily as needed for mild pain or moderate pain.          Marland Kitchen aspirin 81 MG chewable tablet   Oral   Chew 81 mg by mouth daily.         . bisacodyl (DULCOLAX) 10 MG suppository   Rectal   Place 10 mg rectally as needed for moderate constipation.         . bisacodyl (DULCOLAX) 5 MG EC tablet   Oral   Take 2 tablets (10 mg total) by mouth daily as needed for moderate constipation.   30 tablet    0   . Calcium Carbonate-Vitamin D 600-400 MG-UNIT per tablet   Oral   Take 1 tablet by mouth 2 (two) times daily.         . diclofenac sodium (VOLTAREN) 1 % GEL   Topical   Apply 2 g topically 2 (two) times daily as needed (for pain). Pt applies to hips and knees.         Marland Kitchen guaiFENesin-dextromethorphan (ROBITUSSIN DM) 100-10 MG/5ML syrup   Oral   Take 10 mLs by mouth 3 (three) times daily as needed for cough (and/or congestion).         . haloperidol (HALDOL) 0.5 MG tablet   Oral   Take 0.5 mg by mouth daily. Pt is also able to take every six hours as needed for agitation.         Marland Kitchen HYDROcodone-acetaminophen (NORCO/VICODIN) 5-325 MG tablet   Oral   Take 1 tablet by mouth 3 (three) times daily. Pt is also able to take an additional tablet if needed for breakthrough pain.         . hydrocortisone 2.5 % cream   Topical   Apply 1 application topically 2 (two) times daily as needed (for rash).          . hydrOXYzine (  ATARAX/VISTARIL) 25 MG tablet   Oral   Take 25 mg by mouth every 6 (six) hours as needed for itching (and/or agitation).          Marland Kitchen. levothyroxine (SYNTHROID, LEVOTHROID) 75 MCG tablet   Oral   Take 75 mcg by mouth daily before breakfast.          . LORazepam (ATIVAN) 0.5 MG tablet   Oral   Take 0.5 mg by mouth every 8 (eight) hours as needed (for agitation.).          Marland Kitchen. Melatonin 5 MG TABS   Oral   Take 10 mg by mouth at bedtime.         . polyethylene glycol (MIRALAX / GLYCOLAX) packet   Oral   Take 17 g by mouth daily as needed for moderate constipation.         . pravastatin (PRAVACHOL) 10 MG tablet   Oral   Take 10 mg by mouth at bedtime.         . senna (SENOKOT) 8.6 MG tablet   Oral   Take 2 tablets by mouth 2 (two) times daily.         . sertraline (ZOLOFT) 50 MG tablet   Oral   Take 50 mg by mouth daily.           Allergies Review of patient's allergies indicates no known allergies.  Family History  Problem  Relation Age of Onset  . Family history unknown: Yes    Social History Social History  Substance Use Topics  . Smoking status: Never Smoker   . Smokeless tobacco: None  . Alcohol Use: No    Review of Systems Cannot obtain second patient dementia  ____________________________________________   PHYSICAL EXAM:  VITAL SIGNS: ED Triage Vitals  Enc Vitals Group     BP 02/18/16 1828 134/78 mmHg     Pulse Rate 02/18/16 1828 93     Resp 02/18/16 1828 18     Temp 02/18/16 1828 98.6 F (37 C)     Temp Source 02/18/16 1828 Axillary     SpO2 02/18/16 1828 100 %     Weight 02/18/16 1828 140 lb (63.504 kg)     Height 02/18/16 1828 5\' 3"  (1.6 m)     Head Cir --      Peak Flow --      Pain Score --      Pain Loc --      Pain Edu? --      Excl. in GC? --     Constitutional: Alert And pleasantly demented, not speaking does not seem to be obtained as long as we do not move her hip, cachectic. Eyes: Conjunctivae are normal. PERRL. EOMI. Head: Atraumatic. Nose: No congestion/rhinnorhea. Mouth/Throat: Mucous membranes are moist.  Oropharynx non-erythematous. Neck: No stridor.   Nontender with no meningismus Cardiovascular: Normal rate, regular rhythm. Grossly normal heart sounds.  Good peripheral circulation. Respiratory: Normal respiratory effort.  No retractions. Lungs CTAB. Abdominal: Soft and nontender. No distention. No guarding no rebound Back:  There is no focal tenderness or step off there is no midline tenderness there are no lesions noted. there is no CVA tenderness  Musculoskeletal: Tenderness to palpation the right hip, mild foreshortening noted, questionable knee pain no obvious deformity of the knee. Neurologic:  Normal speech and language. No gross focal neurologic deficits are appreciated.  Skin:  Skin is warm, dry and intact. No rash noted. Psychiatric: Mood and affect are normal.  Speech and behavior are normal.  ____________________________________________    LABS (all labs ordered are listed, but only abnormal results are displayed)  Labs Reviewed  PROTIME-INR  CBC WITH DIFFERENTIAL/PLATELET  BASIC METABOLIC PANEL  URINALYSIS COMPLETEWITH MICROSCOPIC ([facility] ONLY)  TYPE AND SCREEN   ____________________________________________  EKG  I personally interpreted any EKGs ordered by me or triage  ____________________________________________  RADIOLOGY  I reviewed any imaging ordered by me or triage that were performed during my shift and, if possible, patient and/or family made aware of any abnormal findings. ____________________________________________   PROCEDURES  Procedure(s) performed: None  Critical Care performed: None  ____________________________________________   INITIAL IMPRESSION / ASSESSMENT AND PLAN / ED COURSE  Pertinent labs & imaging results that were available during my care of the patient were reviewed by me and considered in my medical decision making (see chart for details).  Patient is not ambulatory, nonverbal at this time at her baseline according to facility who has a broken hip. We will see if we can contact next of kin. The next of kin phone number that I have here does not function. Apparently she has an older sister. We will give her pain medication L insert a Foley catheter and we will discuss with family what else that needs to happen for this patient  ----------------------------------------- 8:44 PM on 02/18/2016 -----------------------------------------  Discussed with Trudie Buckler, patient's next of kin, older sister, [phone]. She wants [place] to do whatever will make her daughter more comfortable in including surgery if that is necessary. However, I feel this conversation should be repeated with the patient's next of kin. The patient's care coordinator from the facility is at bedside and states the patient is at her baseline.  ----------------------------------------- 9:23 PM on  02/18/2016 -----------------------------------------  Discussed with Dr. Martha Clan, who will follow the patient as a consult, hospitalist has admitted   FINAL CLINICAL IMPRESSION(S) / ED DIAGNOSES  Final diagnoses:  Hip fx, right, closed, initial encounter [facility])      This chart was dictated using voice recognition software.  Despite best efforts to proofread,  errors can occur which can change meaning.     Jeanmarie Plant, MD 02/18/16 2014  Jeanmarie Plant, MD 02/18/16 0981  Jeanmarie Plant, MD 02/18/16 2124

## 2016-02-18 NOTE — H&P (Signed)
[facility] - [place] at [facility]   PATIENT NAME: Cassandra Steele    MR#:  [id number]  DATE OF BIRTH:  Mar 29, 1936  DATE OF ADMISSION:  02/18/2016  PRIMARY CARE PHYSICIAN: Lyndon Code, MD   REQUESTING/REFERRING PHYSICIAN: Alphonzo Lemmings, MD  CHIEF COMPLAINT:   Chief Complaint  Patient presents with  . Fall  . Hip Pain    HISTORY OF PRESENT ILLNESS:  Cassandra Steele  is a 80 y.o. female who presents with fall and right hip fracture. Patient is demented and lives at a special needs facility, requiring total care. She is supposed to be nonambulatory at baseline. She fell today as she was trying to get out of her wheelchair. She is unable to contribute to her own history, but the facility director is present at bedside and provides the majority of the history. ED physician spoke with the patient's sister, who stated that she would want everything done for the patient that would be in the patient's best interest, including surgery if that were the case. It is unclear at this time if her sister would be her next of kin or her decision maker. Hospitals were called for admission.  PAST MEDICAL HISTORY:   Past Medical History  Diagnosis Date  . Hypertension   . Dementia   . Alzheimer's disease   . Thyroid disease   . Anemia   . Osteoarthritis   . Depression   . Mixed incontinence     PAST SURGICAL HISTORY:   Past Surgical History  Procedure Laterality Date  . No past surgeries      SOCIAL HISTORY:   Social History  Substance Use Topics  . Smoking status: Never Smoker   . Smokeless tobacco: Not on file  . Alcohol Use: No    FAMILY HISTORY:   Family History  Problem Relation Age of Onset  . Family history unknown: Yes   Unable to obtain family history due to the patient's dementia DRUG ALLERGIES:  No Known Allergies  MEDICATIONS AT HOME:   Prior to Admission medications   Medication Sig Start Date End Date Taking? Authorizing Provider   acetaminophen (TYLENOL) 325 MG tablet Take 650 mg by mouth 2 (two) times daily as needed for mild pain or moderate pain.    Yes Historical Provider, MD  aspirin 81 MG chewable tablet Chew 81 mg by mouth daily.   Yes Historical Provider, MD  bisacodyl (DULCOLAX) 10 MG suppository Place 10 mg rectally as needed for moderate constipation.   Yes Historical Provider, MD  bisacodyl (DULCOLAX) 5 MG EC tablet Take 2 tablets (10 mg total) by mouth daily as needed for moderate constipation. 10/10/15  Yes Enedina Finner, MD  Calcium Carbonate-Vitamin D 600-400 MG-UNIT per tablet Take 1 tablet by mouth 2 (two) times daily.   Yes Historical Provider, MD  diclofenac sodium (VOLTAREN) 1 % GEL Apply 2 g topically 2 (two) times daily as needed (for pain). Pt applies to hips and knees.   Yes Historical Provider, MD  guaiFENesin-dextromethorphan (ROBITUSSIN DM) 100-10 MG/5ML syrup Take 10 mLs by mouth 3 (three) times daily as needed for cough (and/or congestion).   Yes Historical Provider, MD  haloperidol (HALDOL) 0.5 MG tablet Take 0.5 mg by mouth daily. Pt is also able to take every six hours as needed for agitation.   Yes Historical Provider, MD  HYDROcodone-acetaminophen (NORCO/VICODIN) 5-325 MG tablet Take 1 tablet by mouth 3 (three) times daily. Pt is also able to take an additional tablet if  needed for breakthrough pain.   Yes Historical Provider, MD  hydrocortisone 2.5 % cream Apply 1 application topically 2 (two) times daily as needed (for rash).    Yes Historical Provider, MD  hydrOXYzine (ATARAX/VISTARIL) 25 MG tablet Take 25 mg by mouth every 6 (six) hours as needed for itching (and/or agitation).    Yes Historical Provider, MD  levothyroxine (SYNTHROID, LEVOTHROID) 75 MCG tablet Take 75 mcg by mouth daily before breakfast.    Yes Historical Provider, MD  LORazepam (ATIVAN) 0.5 MG tablet Take 0.5 mg by mouth every 8 (eight) hours as needed (for agitation.).    Yes Historical Provider, MD  Melatonin 5 MG TABS  Take 10 mg by mouth at bedtime.   Yes Historical Provider, MD  polyethylene glycol (MIRALAX / GLYCOLAX) packet Take 17 g by mouth daily as needed for moderate constipation.   Yes Historical Provider, MD  pravastatin (PRAVACHOL) 10 MG tablet Take 10 mg by mouth at bedtime.   Yes Historical Provider, MD  senna (SENOKOT) 8.6 MG tablet Take 2 tablets by mouth 2 (two) times daily.   Yes Historical Provider, MD  sertraline (ZOLOFT) 50 MG tablet Take 50 mg by mouth daily.   Yes Historical Provider, MD    REVIEW OF SYSTEMS:  Review of Systems  Unable to perform ROS: dementia     VITAL SIGNS:   Filed Vitals:   02/18/16 1828 02/18/16 1830  BP: 134/78 128/75  Pulse: 93 90  Temp: 98.6 F (37 C)   TempSrc: Axillary   Resp: 18 18  Height:  (1.6 m)   Weight: 63.504 kg (140 lb)   SpO2: 100% 99%   Wt Readings from Last 3 Encounters:  02/18/16 63.504 kg (140 lb)  10/09/15 66.815 kg (147 lb 4.8 oz)  10/06/15 68 kg (149 lb 14.6 oz)    PHYSICAL EXAMINATION:  Physical Exam  Vitals reviewed. Constitutional: She appears well-developed and well-nourished. No distress.  HENT:  Head: Normocephalic and atraumatic.  Mouth/Throat: Oropharynx is clear and moist.  Eyes: Conjunctivae and EOM are normal. Pupils are equal, round, and reactive to light. No scleral icterus.  Neck: Normal range of motion. Neck supple. No JVD present. No thyromegaly present.  Cardiovascular: Normal rate, regular rhythm and intact distal pulses.  Exam reveals no gallop and no friction rub.   No murmur heard. Respiratory: Effort normal and breath sounds normal. No respiratory distress. She has no wheezes. She has no rales.  GI: Soft. Bowel sounds are normal. She exhibits no distension. There is no tenderness.  Musculoskeletal: Normal range of motion. She exhibits no edema.  Right hip tender, right leg mildly shortened.   Lymphadenopathy:    She has no cervical adenopathy.  Neurological: No cranial nerve deficit.   Unable to assess due to dementia  Skin: Skin is warm and dry. No rash noted. No erythema.  Psychiatric:  Unable to assess due to dementia    LABORATORY PANEL:   CBC  Recent Labs Lab 02/18/16 2032  WBC 10.4  HGB 12.0  HCT 34.4*  PLT 177   ------------------------------------------------------------------------------------------------------------------  Chemistries   Recent Labs Lab 02/18/16 2032  NA 135  K 3.8  CL 107  CO2 19*  GLUCOSE 228*  BUN 14  CREATININE 0.74  CALCIUM 9.5   ------------------------------------------------------------------------------------------------------------------  Cardiac Enzymes No results for input(s): TROPONINI in the last 168 hours. ------------------------------------------------------------------------------------------------------------------  RADIOLOGY:  Dg Chest 1 View  02/18/2016  CLINICAL DATA:  Recent fall with right hip fracture EXAM: CHEST  1 VIEW COMPARISON:  10/03/2015 FINDINGS: Cardiac shadow is within normal limits. The lungs are well aerated bilaterally. No focal infiltrate is seen. No acute bony abnormality is noted. IMPRESSION: No acute abnormality seen. Electronically Signed   By: Alcide CleverMark  Lukens M.D.   On: 02/18/2016 19:33   Dg Knee Complete 4 Views Right  02/18/2016  CLINICAL DATA:  Recent fall from wheelchair with known hip fracture EXAM: RIGHT KNEE - COMPLETE 4+ VIEW COMPARISON:  None. FINDINGS: Degenerative changes of the right knee joint are noted in all 3 compartments. No joint effusion is seen. No acute fracture or dislocation is noted. IMPRESSION: Severe degenerative changes without acute abnormality. Electronically Signed   By: Alcide CleverMark  Lukens M.D.   On: 02/18/2016 19:32   Dg Hip Unilat With Pelvis 2-3 Views Right  02/18/2016  CLINICAL DATA:  Fall from wheelchair with right hip pain, initial encounter EXAM: DG HIP (WITH OR WITHOUT PELVIS) 2-3V RIGHT COMPARISON:  None. FINDINGS: There is a comminuted proximal right  femoral fracture involving the intratrochanteric region. Mild impaction and angulation is noted at the fracture site. Pelvic ring is intact. Multiple calcified uterine fibroids are seen. IMPRESSION: Comminuted intratrochanteric right femoral fracture. Electronically Signed   By: Alcide CleverMark  Lukens M.D.   On: 02/18/2016 19:26    EKG:   Orders placed or performed during the hospital encounter of 02/18/16  . ED EKG  . ED EKG    IMPRESSION AND PLAN:  Principal Problem:   Fracture, femur, intertrochanteric (HCC) - unclear at this time if surgery would be the best option for this patient given her baseline functioning and quality of life, however we will consult orthopedic surgery to see her tomorrow and help make this decision, weighing the risks of surgery with whatever benefit could potentially provide to this patient. Family decision maker in terms of next if can will also need to be clearly established as a patient is demented and cannot make this decision for herself. IV morphine ordered for pain control. Active Problems:   Alzheimer's dementia - continue behavioral meds at home doses as needed   HTN (hypertension) - stable, continue home meds   Hypothyroidism - continue home dose thyroid replacement   HLD (hyperlipidemia) - continue home dose statin  All the records are reviewed and case discussed with ED provider. Management plans discussed with the patient and/or family.  DVT PROPHYLAXIS: Mechanical only  GI PROPHYLAXIS: None  ADMISSION STATUS: Inpatient  CODE STATUS: DNR Code Status History    Date Active Date Inactive Code Status Order ID Comments User Context   10/09/2015  1:17 AM 10/10/2015  7:20 PM Full Code [id number]  Oralia Manisavid Aubra Pappalardo, MD Inpatient   10/03/2015 11:39 PM 10/06/2015  8:27 PM Full Code [id number]  Marguarite ArbourJeffrey D Sparks, MD Inpatient   04/18/2015  4:36 PM 04/21/2015  7:45 PM Full Code [id number]  Marguarite ArbourJeffrey D Sparks, MD Inpatient    Advance Directive Documentation        Most Recent  Value   Type of Advance Directive  Out of facility DNR (pink MOST or yellow form)   Pre-existing out of facility DNR order (yellow form or pink MOST form)     "MOST" Form in Place?        TOTAL TIME TAKING CARE OF THIS PATIENT: 45 minutes.    Bradey Luzier FIELDING 02/18/2016, 9:14 PM  [facility]  Office  [phone]  CC: Primary care physician; Lyndon CodeKHAN, FOZIA M, MD

## 2016-02-18 NOTE — ED Notes (Addendum)
Cassandra NixonJanice Sales promotion account executive([facility]) [phone].

## 2016-02-18 NOTE — ED Notes (Signed)
Pt transported to xray 

## 2016-02-18 NOTE — ED Notes (Addendum)
Pt arrives via EMS from [name], pt slipped an fell out of her wheelchair last night and now has right hip and knee pain,  Hx of dementia, pt moaning upon arrival, pt under hospice care

## 2016-02-18 NOTE — ED Notes (Signed)
Director of [facility] leaving, wishes to leave name and number: Cassandra LemmingsJanice Steele [phone]

## 2016-02-19 ENCOUNTER — Inpatient Hospital Stay: Admitting: Anesthesiology

## 2016-02-19 ENCOUNTER — Inpatient Hospital Stay

## 2016-02-19 ENCOUNTER — Encounter
Admission: EM | Disposition: A | Discharge status: Skilled Nursing Facility | Payer: Self-pay | Source: Home / Self Care | Attending: Internal Medicine

## 2016-02-19 ENCOUNTER — Encounter: Payer: Self-pay | Admitting: Anesthesiology

## 2016-02-19 DIAGNOSIS — L8990 Pressure ulcer of unspecified site, unspecified stage: Secondary | ICD-10-CM | POA: Insufficient documentation

## 2016-02-19 HISTORY — PX: INTRAMEDULLARY (IM) NAIL INTERTROCHANTERIC: SHX5875

## 2016-02-19 LAB — CBC
HCT: 33.7 % — ABNORMAL LOW (ref 35.0–47.0)
Hemoglobin: 11.6 g/dL — ABNORMAL LOW (ref 12.0–16.0)
MCH: 30.1 pg (ref 26.0–34.0)
MCHC: 34.3 g/dL (ref 32.0–36.0)
MCV: 87.7 fL (ref 80.0–100.0)
PLATELETS: 184 10*3/uL (ref 150–440)
RBC: 3.84 MIL/uL (ref 3.80–5.20)
RDW: 13.3 % (ref 11.5–14.5)
WBC: 12.6 10*3/uL — ABNORMAL HIGH (ref 3.6–11.0)

## 2016-02-19 LAB — BASIC METABOLIC PANEL
Anion gap: 9 (ref 5–15)
BUN: 11 mg/dL (ref 6–20)
CALCIUM: 9.6 mg/dL (ref 8.9–10.3)
CO2: 22 mmol/L (ref 22–32)
CREATININE: 0.65 mg/dL (ref 0.44–1.00)
Chloride: 104 mmol/L (ref 101–111)
GFR calc Af Amer: >60 mL/min (ref >60)
Glucose, Bld: 126 mg/dL — ABNORMAL HIGH (ref 65–99)
POTASSIUM: 3.9 mmol/L (ref 3.5–5.1)
SODIUM: 135 mmol/L (ref 135–145)

## 2016-02-19 LAB — PROTIME-INR
INR: 1.16
PROTHROMBIN TIME: 15 s (ref 11.4–15.0)

## 2016-02-19 LAB — SURGICAL PCR SCREEN
MRSA, PCR: NEGATIVE
Staphylococcus aureus: NEGATIVE

## 2016-02-19 LAB — APTT: aPTT: 26 seconds (ref 24–36)

## 2016-02-19 LAB — ABO/RH: ABO/RH(D): AB POS

## 2016-02-19 SURGERY — FIXATION, FRACTURE, INTERTROCHANTERIC, WITH INTRAMEDULLARY ROD
Anesthesia: Spinal | Laterality: Right | Wound class: Clean

## 2016-02-19 MED ORDER — MAGNESIUM CITRATE PO SOLN
1.0000 | Freq: Once | ORAL | Status: DC | PRN
  Filled 2016-02-19: qty 296

## 2016-02-19 MED ORDER — MENTHOL 3 MG MT LOZG
1.0000 | LOZENGE | OROMUCOSAL | Status: DC | PRN
  Filled 2016-02-19: qty 9

## 2016-02-19 MED ORDER — ONDANSETRON HCL 4 MG/2ML IJ SOLN: 4.0000 mg | Freq: Once | INTRAMUSCULAR | Status: DC | PRN

## 2016-02-19 MED ORDER — MORPHINE SULFATE (PF) 2 MG/ML IV SOLN
2.0000 mg | INTRAVENOUS | Status: DC | PRN
  Administered 2016-02-20: 2 mg via INTRAVENOUS
  Filled 2016-02-19: qty 1

## 2016-02-19 MED ORDER — PHENOL 1.4 % MT LIQD
1.0000 | OROMUCOSAL | Status: DC | PRN
  Filled 2016-02-19: qty 177

## 2016-02-19 MED ORDER — ACETAMINOPHEN 650 MG RE SUPP: 650.0000 mg | Freq: Four times a day (QID) | RECTAL | Status: DC | PRN

## 2016-02-19 MED ORDER — ONDANSETRON HCL 4 MG/2ML IJ SOLN: 4.0000 mg | Freq: Four times a day (QID) | INTRAMUSCULAR | Status: DC | PRN

## 2016-02-19 MED ORDER — ONDANSETRON HCL 4 MG PO TABS: 4.0000 mg | ORAL_TABLET | Freq: Four times a day (QID) | ORAL | Status: DC | PRN

## 2016-02-19 MED ORDER — FERROUS SULFATE 325 (65 FE) MG PO TABS
325.0000 mg | ORAL_TABLET | Freq: Three times a day (TID) | ORAL | Status: DC
  Administered 2016-02-20 – 2016-02-21 (×4): 325 mg via ORAL
  Filled 2016-02-19 (×4): qty 1

## 2016-02-19 MED ORDER — PHENYLEPHRINE HCL 10 MG/ML IJ SOLN
INTRAMUSCULAR | Status: DC | PRN
  Administered 2016-02-19 (×10): 100 ug via INTRAVENOUS

## 2016-02-19 MED ORDER — SODIUM CHLORIDE 0.9 % IV SOLN
75.0000 mL/h | INTRAVENOUS | Status: DC
  Administered 2016-02-19 – 2016-02-20 (×2): 75 mL/h via INTRAVENOUS

## 2016-02-19 MED ORDER — KETAMINE HCL 50 MG/ML IJ SOLN
INTRAMUSCULAR | Status: DC | PRN
  Administered 2016-02-19: 25 mg via INTRAMUSCULAR

## 2016-02-19 MED ORDER — ACETAMINOPHEN 10 MG/ML IV SOLN
INTRAVENOUS | Status: DC | PRN
  Administered 2016-02-19: 1000 mg via INTRAVENOUS

## 2016-02-19 MED ORDER — EPHEDRINE SULFATE 50 MG/ML IJ SOLN
INTRAMUSCULAR | Status: DC | PRN
  Administered 2016-02-19: 5 mg via INTRAVENOUS
  Administered 2016-02-19: 10 mg via INTRAVENOUS

## 2016-02-19 MED ORDER — BISACODYL 10 MG RE SUPP
10.0000 mg | Freq: Every day | RECTAL | Status: DC | PRN
  Administered 2016-02-21: 10 mg via RECTAL
  Filled 2016-02-19: qty 1

## 2016-02-19 MED ORDER — ALUM & MAG HYDROXIDE-SIMETH 200-200-20 MG/5ML PO SUSP: 30.0000 mL | ORAL | Status: DC | PRN

## 2016-02-19 MED ORDER — DEXTROSE 5 % IV SOLN: 2.0000 g | INTRAVENOUS | Status: DC

## 2016-02-19 MED ORDER — ENOXAPARIN SODIUM 30 MG/0.3ML ~~LOC~~ SOLN
30.0000 mg | SUBCUTANEOUS | Status: DC
  Administered 2016-02-20: 30 mg via SUBCUTANEOUS
  Filled 2016-02-19: qty 0.3

## 2016-02-19 MED ORDER — FENTANYL CITRATE (PF) 100 MCG/2ML IJ SOLN: 25.0000 ug | INTRAMUSCULAR | Status: DC | PRN

## 2016-02-19 MED ORDER — DEXTROSE 5 % IV SOLN
2.0000 g | Freq: Once | INTRAVENOUS | Status: AC
  Administered 2016-02-19: 2 g via INTRAVENOUS
  Filled 2016-02-19: qty 2

## 2016-02-19 MED ORDER — MIDAZOLAM HCL 5 MG/5ML IJ SOLN
INTRAMUSCULAR | Status: DC | PRN
  Administered 2016-02-19: 1 mg via INTRAVENOUS

## 2016-02-19 MED ORDER — CEFAZOLIN SODIUM-DEXTROSE 2-3 GM-% IV SOLR
INTRAVENOUS | Status: DC | PRN
  Administered 2016-02-19: 2 g via INTRAVENOUS

## 2016-02-19 MED ORDER — CEFAZOLIN SODIUM-DEXTROSE 2-4 GM/100ML-% IV SOLN
2.0000 g | Freq: Four times a day (QID) | INTRAVENOUS | Status: AC
  Administered 2016-02-19 – 2016-02-20 (×2): 2 g via INTRAVENOUS
  Filled 2016-02-19 (×2): qty 100

## 2016-02-19 MED ORDER — SODIUM CHLORIDE 0.9 % IV SOLN
INTRAVENOUS | Status: DC | PRN
  Administered 2016-02-19 (×2): via INTRAVENOUS

## 2016-02-19 MED ORDER — POLYETHYLENE GLYCOL 3350 17 G PO PACK: 17.0000 g | PACK | Freq: Every day | ORAL | Status: DC | PRN

## 2016-02-19 MED ORDER — DOCUSATE SODIUM 100 MG PO CAPS
100.0000 mg | ORAL_CAPSULE | Freq: Two times a day (BID) | ORAL | Status: DC
  Administered 2016-02-19: 100 mg via ORAL
  Filled 2016-02-19: qty 1

## 2016-02-19 MED ORDER — HYDROCODONE-ACETAMINOPHEN 5-325 MG PO TABS: 1.0000 | ORAL_TABLET | Freq: Four times a day (QID) | ORAL | Status: DC | PRN

## 2016-02-19 MED ORDER — FENTANYL CITRATE (PF) 100 MCG/2ML IJ SOLN
INTRAMUSCULAR | Status: DC | PRN
  Administered 2016-02-19 (×2): 25 ug via INTRAVENOUS

## 2016-02-19 MED ORDER — ACETAMINOPHEN 325 MG PO TABS
650.0000 mg | ORAL_TABLET | Freq: Four times a day (QID) | ORAL | Status: DC | PRN
  Administered 2016-02-20 (×2): 650 mg via ORAL
  Filled 2016-02-19 (×2): qty 2

## 2016-02-19 MED ORDER — NEOMYCIN-POLYMYXIN B GU 40-200000 IR SOLN
Status: DC | PRN
Start: 2016-02-19 — End: 2016-02-19
  Administered 2016-02-19: 4 mL

## 2016-02-19 MED ORDER — PROPOFOL 500 MG/50ML IV EMUL
INTRAVENOUS | Status: DC | PRN
  Administered 2016-02-19: 50 ug/kg/min via INTRAVENOUS

## 2016-02-19 SURGICAL SUPPLY — 40 items
BIT DRILL 4.3MMS DISTAL GRDTED (BIT) ×1 IMPLANT
BIT DRILL CANN LG 4.3MM (BIT) ×1 IMPLANT
CANISTER SUCT 1200ML W/VALVE (MISCELLANEOUS) ×3 IMPLANT
DRAPE INCISE IOBAN 66X45 STRL (DRAPES) ×3 IMPLANT
DRAPE SHEET LG 3/4 BI-LAMINATE (DRAPES) ×3 IMPLANT
DRAPE SURG 17X11 SM STRL (DRAPES) ×9 IMPLANT
DRAPE U-SHAPE 47X51 STRL (DRAPES) ×3 IMPLANT
DRILL 4.3MMS DISTAL GRADUATED (BIT) ×3
DRILL BIT CANN LG 4.3MM (BIT) ×3
DURAPREP 26ML APPLICATOR (WOUND CARE) ×6 IMPLANT
ELECT CAUTERY BLADE 6.4 (BLADE) ×3 IMPLANT
ELECT REM PT RETURN 9FT ADLT (ELECTROSURGICAL) ×3
ELECTRODE REM PT RTRN 9FT ADLT (ELECTROSURGICAL) ×1 IMPLANT
GAUZE SPONGE 4X4 12PLY STRL (GAUZE/BANDAGES/DRESSINGS) ×3 IMPLANT
GLOVE BIOGEL PI IND STRL 9 (GLOVE) ×1 IMPLANT
GLOVE BIOGEL PI INDICATOR 9 (GLOVE) ×2
GLOVE SURG 9.0 ORTHO LTXF (GLOVE) ×3 IMPLANT
GLOVE SURG LX XRAY STRL SZ9 (GLOVE) ×3 IMPLANT
GOWN STRL REUS TWL 2XL XL LVL4 (GOWN DISPOSABLE) ×3 IMPLANT
GOWN STRL REUS W/ TWL LRG LVL3 (GOWN DISPOSABLE) ×1 IMPLANT
GOWN STRL REUS W/TWL LRG LVL3 (GOWN DISPOSABLE) ×2
GUIDEPIN VERSANAIL DSP 3.2X444 ×2 IMPLANT
GUIDEWIRE BALL NOSE 80CM (WIRE) ×3 IMPLANT
HFN RH 130 DEG 9MM X 360MM (Nail) ×3 IMPLANT
HIP FRA NAIL LAG SCREW 10.5X90 (Orthopedic Implant) ×3 IMPLANT
MAT BLUE FLOOR 46X72 FLO (MISCELLANEOUS) ×3 IMPLANT
NEEDLE FILTER BLUNT 18X 1/2SAF (NEEDLE) ×2
NEEDLE FILTER BLUNT 18X1 1/2 (NEEDLE) ×1 IMPLANT
NS IRRIG 1000ML POUR BTL (IV SOLUTION) ×3 IMPLANT
PACK HIP COMPR (MISCELLANEOUS) ×3 IMPLANT
SCREW BONE CORTICAL 5.0X40 (Screw) ×3 IMPLANT
SCREW LAG HIP FRA NAIL 10.5X90 (Orthopedic Implant) ×1 IMPLANT
STAPLER SKIN PROX 35W (STAPLE) ×3 IMPLANT
STRAP SAFETY BODY (MISCELLANEOUS) ×3 IMPLANT
SUT VIC AB 1 CT1 36 (SUTURE) ×3 IMPLANT
SUT VIC AB 2-0 CT1 (SUTURE) ×3 IMPLANT
SUT VICRYL 0 AB UR-6 (SUTURE) ×3 IMPLANT
SYRINGE 10CC LL (SYRINGE) ×3 IMPLANT
TAPE MICROFOAM 4IN (TAPE) ×3 IMPLANT
THREADED GUIDE PIN ×3 IMPLANT

## 2016-02-19 NOTE — Progress Notes (Signed)
Pt. Has confusion with dementia. Admission incomplete.

## 2016-02-19 NOTE — Progress Notes (Signed)
Pharmacy Antibiotic Note  Cassandra LoganFrances Steele is a 80 y.o. female admitted on 02/18/2016 with UTI.  Pharmacy has been consulted for ceftriaxone dosing.  Plan: Ceftriaxone 2 grams q 24 hours ordered.  Height: 5\' 3"  (160 cm) Weight: 133 lb 6.4 oz (60.51 kg) IBW/kg (Calculated) : 52.4  Temp (24hrs), Avg:98.7 F (37.1 C), Min:98.6 F (37 C), Max:98.8 F (37.1 C)   Recent Labs Lab 02/18/16 2032  WBC 10.4  CREATININE 0.74    Estimated Creatinine Clearance: 48.7 mL/min (by C-G formula based on Cr of 0.74).    No Known Allergies  Antimicrobials this admission: ceftriaxone  >>    >>   Dose adjustments this admission:   Microbiology results:  4/7 UCx: pending   4/7 UA: LE(+) NO2(+) WBC 6-30  Thank you for allowing pharmacy to be a part of this patient's care.  Tracen Mahler S 02/19/2016 12:55 AM

## 2016-02-19 NOTE — Anesthesia Preprocedure Evaluation (Addendum)
Anesthesia Evaluation  Patient identified by MRN, date of birth, ID band Patient awake    Reviewed: Allergy & Precautions, NPO status , Patient's Chart, lab work & pertinent test results  Airway Mallampati: II  TM Distance: >3 FB     Dental  (+) Missing   Pulmonary neg pulmonary ROS,    Pulmonary exam normal breath sounds clear to auscultation       Cardiovascular hypertension, Pt. on medications Normal cardiovascular exam     Neuro/Psych PSYCHIATRIC DISORDERS Depression Alzheimers disease    GI/Hepatic negative GI ROS, Neg liver ROS,   Endo/Other  Hypothyroidism   Renal/GU negative Renal ROS  negative genitourinary   Musculoskeletal  (+) Arthritis , Osteoarthritis,  Fx   Abdominal Normal abdominal exam  (+)   Peds negative pediatric ROS (+)  Hematology  (+) anemia ,   Anesthesia Other Findings Patient not verbal  Reproductive/Obstetrics negative OB ROS                         Anesthesia Physical Anesthesia Plan  ASA: III and emergent  Anesthesia Plan: Spinal   Post-op Pain Management:    Induction: Intravenous  Airway Management Planned: Nasal Cannula  Additional Equipment:   Intra-op Plan:   Post-operative Plan:   Informed Consent: I have reviewed the patients History and Physical, chart, labs and discussed the procedure including the risks, benefits and alternatives for the proposed anesthesia with the patient or authorized representative who has indicated his/her understanding and acceptance.   Dental advisory given  Plan Discussed with: CRNA and Surgeon  Anesthesia Plan Comments:        Anesthesia Quick Evaluation

## 2016-02-19 NOTE — Op Note (Signed)
DATE OF SURGERY:  02/19/2016  TIME: 4:32 PM  PATIENT NAME:  Cassandra Steele  AGE: 80 y.o.  PRE-OPERATIVE DIAGNOSIS:  right intertrochanteric hip fracture  POST-OPERATIVE DIAGNOSIS:  SAME  PROCEDURE:  INTRAMEDULLARY (IM) NAIL  OF RIGHT INTERTROCHANTRIC HIP FRACTURE  SURGEON:  Juanell FairlyKRASINSKI, Omayra Tulloch  OPERATIVE IMPLANTS: Biomet long Affixus nail 9 x 360mm, 90 mm lag screw with a 40 mm distal interlocking screw  PREOPERATIVE INDICATIONS:  Cassandra Steele is a [age] y.o. year old who fell and suffered a hip fracture. She was brought into the ER and then admitted and medically cleared for surgical intervention.    The risks, benefits and alternatives were discussed with the patient and their family.  The risks include but are not limited to infection, bleeding, nerve or blood vessel injury, malunion, nonunion, hardware prominence, hardware failure, change in leg lengths or lower extremity rotation need for further surgery including hardware removal with conversion to a total hip arthroplasty. Medical risks include but are not limited to DVT and pulmonary embolism, myocardial infarction, stroke, pneumonia, respiratory failure and death. The patient and their family understood these risks and wished to proceed with surgery.  OPERATIVE PROCEDURE:  The patient was brought to the operating room and placed in the supine position on the fracture table. General anesthesia was administered.  A closed reduction was performed under C-arm guidance.  The fracture reduction was confirmed on both AP and lateral views. A time out was performed to verify the patient's name, date of birth, medical record number, correct site of surgery correct procedure to be performed. The timeout was also used to verify the patient received antibiotics and all appropriate instruments, implants and radiographic studies were available in the room. Once all in attendance were in agreement, the case began. The patient was prepped and draped in a  sterile fashion. She received 2 grams of ancef as preoperative antibiotics.  An incision was made proximal to the greater trochanter in line with the femur. A guidewire was placed over the tip of the greater trochanter and advanced into the proximal femur to the level of the lesser trochanter.  Confirmation of the drill pin position was made on AP and lateral C-arm images.  The threaded guidepin was then overdrilled with the proximal femoral drill.  The nail was then inserted into the proximal femur, across the fracture site and into the femoral shaft. Its position was confirmed on AP and lateral C-arm images.   Once the nail was completely seated, the drill guide for the lag screw was placed through the guide arm for the Affixus nail. A guidepin was then placed through this drill guide and advanced through the lateral cortex of the femur, across the fracture site and into the femoral head achieving a tip apex distance of less than 25 mm. The length of the drill pin was measured, and then the drill for the lag screw was advanced through the lateral cortex, across the fracture site and up into the femoral head to the depth of the lag screw..  The lag screw was then advanced by hand into position across the fracture site into the femoral head. Its final position was confirmed on AP and lateral C-arm images. Compression was applied as traction was carefully released. The set screw in the top of the intramedullary rod was tightened by hand using a screwdriver. It was backed off a quarter turn to allow for compression at the fracture site.  The attention was then turned to placement of the  distal interlocking screw. A perfect circle technique was used. A stab incision was made over the oblique hole. A free hand technique was used to drill bicortically. A depth gauge was used to assess length of the distal interlocking screw which then was placed by hand.  Final C-arm images of the entire intramedullary construct  were taken in both the AP and lateral planes.   The wounds were irrigated copiously and closed with 0 Vicryl for closure of the deep fascia and 2-0 Vicryl for subcutaneous closure. The skin was approximated with staples. A dry sterile dressing was applied. I was scrubbed and present the entire case and all sharp and instrument counts were correct at the conclusion of the case. Patient was transferred to hospital bed and brought to PACU in stable condition. I spoke with the patient's family in the postop consultation room to let them know the case had gone without complication and the patient was stable in the recovery room.    Kathreen Devoid, MD

## 2016-02-19 NOTE — Progress Notes (Addendum)
[facility] [place] at [facility]   PATIENT NAME: Cassandra LoganFrances Hodsdon    MR#:  [id number]  DATE OF BIRTH:  09/29/1936  SUBJECTIVE:  CHIEF COMPLAINT:  Patient is demented and unable to get any history from her. Resting comfortably  REVIEW OF SYSTEMS:  Review of system unobtainable with given history of chronic dementia  DRUG ALLERGIES:  No Known Allergies  VITALS:  Blood pressure 122/70, pulse 94, temperature 98.1 F (36.7 C), temperature source Oral, resp. rate 18, height 5\' 3"  (1.6 m), weight 60.51 kg (133 lb 6.4 oz), SpO2 100 %.  PHYSICAL EXAMINATION:  GENERAL:  80 y.o.-year-old patient lying in the bed with no acute distress.  EYES: Pupils equal, round, reactive to light and accommodation. No scleral icterus.  HEENT: Head atraumatic, normocephalic.  NECK:  Supple, no jugular venous distention. No thyroid enlargement, no tenderness.  LUNGS: Normal breath sounds bilaterally, no wheezing, rales,rhonchi or crepitation. No use of accessory muscles of respiration.  CARDIOVASCULAR: S1, S2 normal. No murmurs, rubs, or gallops.  ABDOMEN: Soft, nontender, nondistended. Bowel sounds present. No organomegaly or mass.  EXTREMITIES: Right lower extremity externally rotated and abducted No pedal edema, cyanosis, or clubbing.  NEUROLOGIC: Patient is demented. Gait not checked.  PSYCHIATRIC: The patient is alert and demented SKIN: No obvious rash, lesion, or ulcer.    LABORATORY PANEL:   CBC  Recent Labs Lab 02/19/16 0402  WBC 12.6*  HGB 11.6*  HCT 33.7*  PLT 184   ------------------------------------------------------------------------------------------------------------------  Chemistries   Recent Labs Lab 02/19/16 0402  NA 135  K 3.9  CL 104  CO2 22  GLUCOSE 126*  BUN 11  CREATININE 0.65  CALCIUM 9.6   ------------------------------------------------------------------------------------------------------------------  Cardiac Enzymes No results  for input(s): TROPONINI in the last 168 hours. ------------------------------------------------------------------------------------------------------------------  RADIOLOGY:  Dg Chest 1 View  02/18/2016  CLINICAL DATA:  Recent fall with right hip fracture EXAM: CHEST 1 VIEW COMPARISON:  10/03/2015 FINDINGS: Cardiac shadow is within normal limits. The lungs are well aerated bilaterally. No focal infiltrate is seen. No acute bony abnormality is noted. IMPRESSION: No acute abnormality seen. Electronically Signed   By: Alcide CleverMark  Lukens M.D.   On: 02/18/2016 19:33   Dg Knee Complete 4 Views Right  02/18/2016  CLINICAL DATA:  Recent fall from wheelchair with known hip fracture EXAM: RIGHT KNEE - COMPLETE 4+ VIEW COMPARISON:  None. FINDINGS: Degenerative changes of the right knee joint are noted in all 3 compartments. No joint effusion is seen. No acute fracture or dislocation is noted. IMPRESSION: Severe degenerative changes without acute abnormality. Electronically Signed   By: Alcide CleverMark  Lukens M.D.   On: 02/18/2016 19:32   Dg Hip Unilat With Pelvis 2-3 Views Right  02/18/2016  CLINICAL DATA:  Fall from wheelchair with right hip pain, initial encounter EXAM: DG HIP (WITH OR WITHOUT PELVIS) 2-3V RIGHT COMPARISON:  None. FINDINGS: There is a comminuted proximal right femoral fracture involving the intratrochanteric region. Mild impaction and angulation is noted at the fracture site. Pelvic ring is intact. Multiple calcified uterine fibroids are seen. IMPRESSION: Comminuted intratrochanteric right femoral fracture. Electronically Signed   By: Alcide CleverMark  Lukens M.D.   On: 02/18/2016 19:26    EKG:   Orders placed or performed during the hospital encounter of 02/18/16  . ED EKG  . ED EKG    ASSESSMENT AND PLAN:   # Fracture, rt femur, intertrochanteric (HCC) - IV morphine prn  ordered for pain control. patient's sister wants to  proceed with  intramedullary fixation for the patient's fracture as per ortho note.  Scheduled for intramedullary fixation today  # Alzheimer's dementia - continue behavioral meds at home doses as needed after surgery  #Abnormal urinalysis-urine culture is pending. We will continue IV Rocephin per pharmacy consult  # HTN (hypertension) - stable, continue home meds  # Hypothyroidism - continue home dose thyroid replacement  #HLD (hyperlipidemia) - continue home dose statin  All the records are reviewed and case discussed with Care Management/Social Workerr.   CODE STATUS:   TOTAL TIME TAKING CARE OF THIS PATIENT: 35  minutes.   POSSIBLE D/C IN 2-3  DAYS, DEPENDING ON CLINICAL CONDITION.   Ramonita Lab M.D on 02/19/2016 at 1:19 PM  Between 7am to 6pm - Pager - [phone] After 6pm go to [email or web address] - password [email or web address] [facility]  [place] [facility]  Office  [phone]  CC: Primary care physician; Lyndon Code, MD

## 2016-02-19 NOTE — Anesthesia Procedure Notes (Signed)
Spinal Patient location during procedure: OR End time: 02/19/2016 2:45 PM Staffing Anesthesiologist: Alvin Critchley Resident/CRNA: Johnna Acosta Performed by: resident/CRNA  Preanesthetic Checklist Completed: patient identified, site marked, surgical consent, pre-op evaluation, timeout performed, IV checked, risks and benefits discussed and monitors and equipment checked Spinal Block Patient position: sitting Prep: Betadine Patient monitoring: heart rate, continuous pulse ox, blood pressure and cardiac monitor Approach: midline Location: L4-5 Injection technique: single-shot Needle Needle type: Whitacre and Introducer  Needle gauge: 25 G Needle length: 9 cm Additional Notes Negative paresthesia. Negative blood return. Positive free-flowing CSF. Expiration date of kit checked and confirmed. Patient tolerated procedure well, without complications.

## 2016-02-19 NOTE — Consult Note (Addendum)
ORTHOPAEDIC CONSULTATION  REQUESTING PHYSICIAN: Ramonita Lab, MD  Chief Complaint: Right hip pain s/p fall  HPI: Cassandra Steele is a 80 y.o. female with advanced dementia who has right hip pain after a fall. She presented to the [facility] regional emergency room where x-rays revealed a displaced intertrochanteric hip fracture. I saw the patient today in her hospital room. Given her dementia she is unable to provide any history. There is no family at the bedside. She appears comfortable. She has talked her right leg under her left thigh in a figure 4 position. The nurses state they have straighten her leg and she goes back to assuming that this position. I did talk to the her sister by phone who states that she ambulates at baseline.  Past Medical History  Diagnosis Date  . Hypertension   . Dementia   . Alzheimer's disease   . Thyroid disease   . Anemia   . Osteoarthritis   . Depression   . Mixed incontinence    Past Surgical History  Procedure Laterality Date  . No past surgeries     Social History   Social History  . Marital Status: Widowed    Spouse Name: N/A  . Number of Children: N/A  . Years of Education: N/A   Social History Main Topics  . Smoking status: Never Smoker   . Smokeless tobacco: None  . Alcohol Use: No  . Drug Use: No  . Sexual Activity: No   Other Topics Concern  . None   Social History Narrative   Family History  Problem Relation Age of Onset  . Family history unknown: Yes   No Known Allergies Prior to Admission medications   Medication Sig Start Date End Date Taking? Authorizing Provider  acetaminophen (TYLENOL) 325 MG tablet Take 650 mg by mouth 2 (two) times daily as needed for mild pain or moderate pain.    Yes Historical Provider, MD  aspirin 81 MG chewable tablet Chew 81 mg by mouth daily.   Yes Historical Provider, MD  bisacodyl (DULCOLAX) 10 MG suppository Place 10 mg rectally as needed for moderate constipation.   Yes Historical  Provider, MD  bisacodyl (DULCOLAX) 5 MG EC tablet Take 2 tablets (10 mg total) by mouth daily as needed for moderate constipation. 10/10/15  Yes Enedina Finner, MD  Calcium Carbonate-Vitamin D 600-400 MG-UNIT per tablet Take 1 tablet by mouth 2 (two) times daily.   Yes Historical Provider, MD  diclofenac sodium (VOLTAREN) 1 % GEL Apply 2 g topically 2 (two) times daily as needed (for pain). Pt applies to hips and knees.   Yes Historical Provider, MD  guaiFENesin-dextromethorphan (ROBITUSSIN DM) 100-10 MG/5ML syrup Take 10 mLs by mouth 3 (three) times daily as needed for cough (and/or congestion).   Yes Historical Provider, MD  haloperidol (HALDOL) 0.5 MG tablet Take 0.5 mg by mouth daily. Pt is also able to take every six hours as needed for agitation.   Yes Historical Provider, MD  HYDROcodone-acetaminophen (NORCO/VICODIN) 5-325 MG tablet Take 1 tablet by mouth 3 (three) times daily. Pt is also able to take an additional tablet if needed for breakthrough pain.   Yes Historical Provider, MD  hydrocortisone 2.5 % cream Apply 1 application topically 2 (two) times daily as needed (for rash).    Yes Historical Provider, MD  hydrOXYzine (ATARAX/VISTARIL) 25 MG tablet Take 25 mg by mouth every 6 (six) hours as needed for itching (and/or agitation).    Yes Historical Provider, MD  levothyroxine (SYNTHROID,  LEVOTHROID) 75 MCG tablet Take 75 mcg by mouth daily before breakfast.    Yes Historical Provider, MD  LORazepam (ATIVAN) 0.5 MG tablet Take 0.5 mg by mouth every 8 (eight) hours as needed (for agitation.).    Yes Historical Provider, MD  Melatonin 5 MG TABS Take 10 mg by mouth at bedtime.   Yes Historical Provider, MD  polyethylene glycol (MIRALAX / GLYCOLAX) packet Take 17 g by mouth daily as needed for moderate constipation.   Yes Historical Provider, MD  pravastatin (PRAVACHOL) 10 MG tablet Take 10 mg by mouth at bedtime.   Yes Historical Provider, MD  senna (SENOKOT) 8.6 MG tablet Take 2 tablets by mouth 2  (two) times daily.   Yes Historical Provider, MD  sertraline (ZOLOFT) 50 MG tablet Take 50 mg by mouth daily.   Yes Historical Provider, MD   Dg Chest 1 View  02/18/2016  CLINICAL DATA:  Recent fall with right hip fracture EXAM: CHEST 1 VIEW COMPARISON:  10/03/2015 FINDINGS: Cardiac shadow is within normal limits. The lungs are well aerated bilaterally. No focal infiltrate is seen. No acute bony abnormality is noted. IMPRESSION: No acute abnormality seen. Electronically Signed   By: Alcide Clever M.D.   On: 02/18/2016 19:33   Dg Knee Complete 4 Views Right  02/18/2016  CLINICAL DATA:  Recent fall from wheelchair with known hip fracture EXAM: RIGHT KNEE - COMPLETE 4+ VIEW COMPARISON:  None. FINDINGS: Degenerative changes of the right knee joint are noted in all 3 compartments. No joint effusion is seen. No acute fracture or dislocation is noted. IMPRESSION: Severe degenerative changes without acute abnormality. Electronically Signed   By: Alcide Clever M.D.   On: 02/18/2016 19:32   Dg Hip Unilat With Pelvis 2-3 Views Right  02/18/2016  CLINICAL DATA:  Fall from wheelchair with right hip pain, initial encounter EXAM: DG HIP (WITH OR WITHOUT PELVIS) 2-3V RIGHT COMPARISON:  None. FINDINGS: There is a comminuted proximal right femoral fracture involving the intratrochanteric region. Mild impaction and angulation is noted at the fracture site. Pelvic ring is intact. Multiple calcified uterine fibroids are seen. IMPRESSION: Comminuted intratrochanteric right femoral fracture. Electronically Signed   By: Alcide Clever M.D.   On: 02/18/2016 19:26    Positive ROS: All other systems have been reviewed and were otherwise negative with the exception of those mentioned in the HPI and as above.  Physical Exam: General: Alert, no acute distress  MUSCULOSKELETAL: Right lower extremity: Patient's skin is intact. She has palpable pedal pulses. I cannot assess the patient's sensory or motor function given her dementia and  inability to follow commands. Her right leg is currently in a figure 4 position under her left thigh.  Assessment: Right intertrochanteric hip fracture, displaced  Plan: I recommended to the patient's sister that we proceed with intramedullary fixation for the patient's fracture. This will help with her pain and give her the chance to stand and ambulate again. I do not expect the fracture to heal without surgery. She will likely be confined to bed rest without surgery. I also would expect the patient would have persistent pain with nursing care or rotation. I explained to her sister about the surgery and the postoperative course. She does understand that there are risks of surgery. The risks include but are not limited to infection, bleeding requiring blood transfusion, nerve or blood vessel injury, persistent pain, hardware failure leg length discrepancy, change in lower extremity rotation and the need for further surgery. Medical risks include  DVT and pulmonary embolism, myocardial infarction, stroke, pneumonia, respiratory failure and death. Patient has been cleared for surgery by the hospitalist service. I reviewed the patient's labs and radiographic studies in preparation for this case.  I obtained surgical consent from the patient's older sister, Ms. Cassandra Steele, by phone. The patient's nurse, Tobi Bastosnna, also spoke with the patient's sister to confirm surgical consent.    Juanell FairlyKRASINSKI, Naftula Donahue, MD    02/19/2016 11:23 AM

## 2016-02-19 NOTE — Progress Notes (Signed)
Pt placed on contact precautions for history of ESBL.

## 2016-02-19 NOTE — Transfer of Care (Signed)
Immediate Anesthesia Transfer of Care Note  Patient: Cassandra Steele  Procedure(s) Performed: Procedure(s): INTRAMEDULLARY (IM) NAIL INTERTROCHANTRIC (Right)  Patient Location: PACU  Anesthesia Type:Spinal  Level of Consciousness: awake, alert  and oriented dementia^  Airway & Oxygen Therapy: Patient Spontanous Breathing  Post-op Assessment: Report given to RN and Post -op Vital signs reviewed and stable  Post vital signs: Reviewed and stable  Last Vitals:  Filed Vitals:   02/19/16 0723 02/19/16 1633  BP: 122/70 111/73  Pulse: 94 101  Temp: 36.7 C 36.4 C  Resp: 18 18    Complications: No apparent anesthesia complications

## 2016-02-20 LAB — CBC
HEMATOCRIT: 32 % — AB (ref 35.0–47.0)
HEMOGLOBIN: 11.2 g/dL — AB (ref 12.0–16.0)
MCH: 30.8 pg (ref 26.0–34.0)
MCHC: 35 g/dL (ref 32.0–36.0)
MCV: 87.9 fL (ref 80.0–100.0)
Platelets: 182 10*3/uL (ref 150–440)
RBC: 3.65 MIL/uL — AB (ref 3.80–5.20)
RDW: 13.2 % (ref 11.5–14.5)
WBC: 15.2 10*3/uL — ABNORMAL HIGH (ref 3.6–11.0)

## 2016-02-20 LAB — BASIC METABOLIC PANEL
Anion gap: 11 (ref 5–15)
BUN: 11 mg/dL (ref 6–20)
CHLORIDE: 106 mmol/L (ref 101–111)
CO2: 18 mmol/L — ABNORMAL LOW (ref 22–32)
Calcium: 9.3 mg/dL (ref 8.9–10.3)
Creatinine, Ser: 0.69 mg/dL (ref 0.44–1.00)
GFR calc non Af Amer: >60 mL/min (ref >60)
Glucose, Bld: 120 mg/dL — ABNORMAL HIGH (ref 65–99)
POTASSIUM: 4.2 mmol/L (ref 3.5–5.1)
SODIUM: 135 mmol/L (ref 135–145)

## 2016-02-20 MED ORDER — ENOXAPARIN SODIUM 40 MG/0.4ML ~~LOC~~ SOLN
40.0000 mg | SUBCUTANEOUS | Status: DC
  Administered 2016-02-21: 40 mg via SUBCUTANEOUS
  Filled 2016-02-20: qty 0.4

## 2016-02-20 MED ORDER — DEXTROSE 5 % IV SOLN
1.0000 g | INTRAVENOUS | Status: DC
  Administered 2016-02-20: 1 g via INTRAVENOUS
  Filled 2016-02-20 (×2): qty 10

## 2016-02-20 MED ORDER — SODIUM CHLORIDE 0.9 % IV BOLUS (SEPSIS)
500.0000 mL | Freq: Once | INTRAVENOUS | Status: AC
  Administered 2016-02-20: 500 mL via INTRAVENOUS

## 2016-02-20 NOTE — Progress Notes (Signed)
[facility] -  at [facility]   PATIENT NAME: Cassandra Steele    MR#:  [id number]  DATE OF BIRTH:  October 27, 1936  SUBJECTIVE:  CHIEF COMPLAINT:  Patient is demented and unable to get any history from her. Had surgery yesterday. Resting comfortably  REVIEW OF SYSTEMS:  Review of system unobtainable with given history of chronic dementia  DRUG ALLERGIES:  No Known Allergies  VITALS:  Blood pressure 123/70, pulse 100, temperature 98.8 F (37.1 C), temperature source Oral, resp. rate 18, height  (1.6 m), weight 60.51 kg (133 lb 6.4 oz), SpO2 100 %.  PHYSICAL EXAMINATION:  GENERAL:  80 y.o.-year-old patient lying in the bed with no acute distress.  EYES: Pupils equal, round, reactive to light and accommodation. No scleral icterus.  HEENT: Head atraumatic, normocephalic.  NECK:  Supple, no jugular venous distention. No thyroid enlargement, no tenderness.  LUNGS: Normal breath sounds bilaterally, no wheezing, rales,rhonchi or crepitation. No use of accessory muscles of respiration.  CARDIOVASCULAR: S1, S2 normal. No murmurs, rubs, or gallops.  ABDOMEN: Soft, nontender, nondistended. Bowel sounds present. No organomegaly or mass.  EXTREMITIES: Right hip area with honey comb dressing,no cyanosis, or clubbing.  NEUROLOGIC: Patient is demented. Gait not checked.  PSYCHIATRIC: The patient is alert and demented SKIN: No obvious rash, lesion, or ulcer.    LABORATORY PANEL:   CBC  Recent Labs Lab 02/20/16 0458  WBC 15.2*  HGB 11.2*  HCT 32.0*  PLT 182   ------------------------------------------------------------------------------------------------------------------  Chemistries   Recent Labs Lab 02/20/16 0458  NA 135  K 4.2  CL 106  CO2 18*  GLUCOSE 120*  BUN 11  CREATININE 0.69  CALCIUM 9.3   ------------------------------------------------------------------------------------------------------------------  Cardiac Enzymes No results  for input(s): TROPONINI in the last 168 hours. ------------------------------------------------------------------------------------------------------------------  RADIOLOGY:  Dg Chest 1 View  02/18/2016  CLINICAL DATA:  Recent fall with right hip fracture EXAM: CHEST 1 VIEW COMPARISON:  10/03/2015 FINDINGS: Cardiac shadow is within normal limits. The lungs are well aerated bilaterally. No focal infiltrate is seen. No acute bony abnormality is noted. IMPRESSION: No acute abnormality seen. Electronically Signed   By: Alcide Clever M.D.   On: 02/18/2016 19:33   Dg Knee Complete 4 Views Right  02/18/2016  CLINICAL DATA:  Recent fall from wheelchair with known hip fracture EXAM: RIGHT KNEE - COMPLETE 4+ VIEW COMPARISON:  None. FINDINGS: Degenerative changes of the right knee joint are noted in all 3 compartments. No joint effusion is seen. No acute fracture or dislocation is noted. IMPRESSION: Severe degenerative changes without acute abnormality. Electronically Signed   By: Alcide Clever M.D.   On: 02/18/2016 19:32   Dg Hip Operative Unilat W Or W/o Pelvis Right  02/19/2016  CLINICAL DATA:  ORIF intertrochanteric right femoral neck fracture with intramedullary nail and compression screw. Intraoperative imaging. EXAM: OPERATIVE RIGHT HIP (WITH PELVIS IF PERFORMED) 2 VIEWS TECHNIQUE: Fluoroscopic spot image(s) were submitted for interpretation post-operatively. COMPARISON:  Preoperative right hip x-rays yesterday. FINDINGS: ORIF of the intertrochanteric right femoral neck fracture with near anatomic alignment. No visible complicating features. IMPRESSION: Near anatomic alignment post ORIF of the intertrochanteric right femoral neck fracture without visible complications. Electronically Signed   By: Hulan Saas M.D.   On: 02/19/2016 16:28   Dg Hip Unilat With Pelvis 2-3 Views Right  02/18/2016  CLINICAL DATA:  Fall from wheelchair with right hip pain, initial encounter EXAM: DG HIP (WITH OR WITHOUT PELVIS)  2-3V RIGHT COMPARISON:  None. FINDINGS: There  is a comminuted proximal right femoral fracture involving the intratrochanteric region. Mild impaction and angulation is noted at the fracture site. Pelvic ring is intact. Multiple calcified uterine fibroids are seen. IMPRESSION: Comminuted intratrochanteric right femoral fracture. Electronically Signed   By: Alcide CleverMark  Lukens M.D.   On: 02/18/2016 19:26   Dg Femur Port, Min 2 Views Right  02/19/2016  CLINICAL DATA:  Status post right hip fracture repair EXAM: RIGHT FEMUR PORTABLE 1 VIEW COMPARISON:  February 18, 2016 FINDINGS: A gamma nail and rod have been placed across the intertrochanteric fracture. Alignment is improved. The rod is fixed distally with a single screw. No dislocation. Skin staples are seen distally and proximally. IMPRESSION: Status post right hip fracture repair. Hardware is in good position. Electronically Signed   By: Gerome Samavid  Williams III M.D   On: 02/19/2016 17:29    EKG:   Orders placed or performed during the hospital encounter of 02/18/16  . ED EKG  . ED EKG    ASSESSMENT AND PLAN:   # Fracture, rt femur, intertrochanteric (HCC) - POD #1 Per ortho  IV morphine prn  ordered for pain control.  s/p  intramedullary fixation 4/8 DVT px with lovenox 40 sq  # Alzheimer's dementia - continue behavioral meds at home doses as needed after surgery  #Acute cystitis  -urine culture with >100,000 colonies of gm neg bacteria. We will continue IV Rocephin D/ced foley cath, in and out cath if needed  # HTN (hypertension) - stable, continue home meds  # Hypothyroidism - continue home dose thyroid replacement  #HLD (hyperlipidemia) - continue home dose statin  All the records are reviewed and case discussed with Care Management/Social Workerr.   CODE STATUS:   TOTAL TIME TAKING CARE OF THIS PATIENT: 35  minutes.   POSSIBLE D/C IN 1-2 DAYS, DEPENDING ON CLINICAL CONDITION.   Ramonita LabGouru, Tyresha Fede M.D on 02/20/2016 at 5:11 PM  Between 7am to  6pm - Pager - [phone] After 6pm go to [email or web address] - password [email or web address] [facility]  [facility]  Office  [phone]  CC: Primary care physician; Lyndon CodeKHAN, FOZIA M, MD

## 2016-02-20 NOTE — Progress Notes (Signed)
Subjective:  Postop day #1 status post intramedullary fixation for right intertrochanteric hip fracture. Patient is resting comfortably in bed. She has advanced dementia and is unable to provide clinical information. Patient with low-grade fever to 100.1 this morning.  Objective:   VITALS:   Filed Vitals:   02/19/16 2206 02/19/16 2309 02/20/16 0552 02/20/16 0725  BP: 109/81 121/72 110/62 138/81  Pulse: 101 95 108 103  Temp: 99.1 F (37.3 C) 98.9 F (37.2 C) 98.7 F (37.1 C) 100.1 F (37.8 C)  TempSrc: Oral Oral Oral Oral  Resp: 22 20 20 18   Height:      Weight:      SpO2: 100% 100% 93% 100%    PHYSICAL EXAM:   Right lower extremity: Patient's dressings are clean dry and intact. Her thigh compartments are soft and compressible. She has mild swelling but no significant ecchymosis. Patient making spontaneous movements with her ankles. Cannot assess sensation due to dementia. Patient with palpable pedal pulses.   LABS  Results for orders placed or performed during the hospital encounter of 02/18/16 (from the past 24 hour(s))  CBC     Status: Abnormal   Collection Time: 02/20/16  4:58 AM  Result Value Ref Range   WBC 15.2 (H) 3.6 - 11.0 K/uL   RBC 3.65 (L) 3.80 - 5.20 MIL/uL   Hemoglobin 11.2 (L) 12.0 - 16.0 g/dL   HCT 40.932.0 (L) 81.135.0 - 91.447.0 %   MCV 87.9 80.0 - 100.0 fL   MCH 30.8 26.0 - 34.0 pg   MCHC 35.0 32.0 - 36.0 g/dL   RDW 78.213.2 95.611.5 - 21.314.5 %   Platelets 182 150 - 440 K/uL  Basic metabolic panel     Status: Abnormal   Collection Time: 02/20/16  4:58 AM  Result Value Ref Range   Sodium 135 135 - 145 mmol/L   Potassium 4.2 3.5 - 5.1 mmol/L   Chloride 106 101 - 111 mmol/L   CO2 18 (L) 22 - 32 mmol/L   Glucose, Bld 120 (H) 65 - 99 mg/dL   BUN 11 6 - 20 mg/dL   Creatinine, Ser 0.860.69 0.44 - 1.00 mg/dL   Calcium 9.3 8.9 - 57.810.3 mg/dL   GFR calc non Af Amer >60 >60 mL/min   GFR calc Af Amer >60 >60 mL/min   Anion gap 11 5 - 15    Dg Chest 1 View  02/18/2016   CLINICAL DATA:  Recent fall with right hip fracture EXAM: CHEST 1 VIEW COMPARISON:  10/03/2015 FINDINGS: Cardiac shadow is within normal limits. The lungs are well aerated bilaterally. No focal infiltrate is seen. No acute bony abnormality is noted. IMPRESSION: No acute abnormality seen. Electronically Signed   By: Alcide CleverMark  Lukens M.D.   On: 02/18/2016 19:33   Dg Knee Complete 4 Views Right  02/18/2016  CLINICAL DATA:  Recent fall from wheelchair with known hip fracture EXAM: RIGHT KNEE - COMPLETE 4+ VIEW COMPARISON:  None. FINDINGS: Degenerative changes of the right knee joint are noted in all 3 compartments. No joint effusion is seen. No acute fracture or dislocation is noted. IMPRESSION: Severe degenerative changes without acute abnormality. Electronically Signed   By: Alcide CleverMark  Lukens M.D.   On: 02/18/2016 19:32   Dg Hip Operative Unilat W Or W/o Pelvis Right  02/19/2016  CLINICAL DATA:  ORIF intertrochanteric right femoral neck fracture with intramedullary nail and compression screw. Intraoperative imaging. EXAM: OPERATIVE RIGHT HIP (WITH PELVIS IF PERFORMED) 2 VIEWS TECHNIQUE: Fluoroscopic spot image(s) were submitted  for interpretation post-operatively. COMPARISON:  Preoperative right hip x-rays yesterday. FINDINGS: ORIF of the intertrochanteric right femoral neck fracture with near anatomic alignment. No visible complicating features. IMPRESSION: Near anatomic alignment post ORIF of the intertrochanteric right femoral neck fracture without visible complications. Electronically Signed   By: Hulan Saas M.D.   On: 02/19/2016 16:28   Dg Hip Unilat With Pelvis 2-3 Views Right  02/18/2016  CLINICAL DATA:  Fall from wheelchair with right hip pain, initial encounter EXAM: DG HIP (WITH OR WITHOUT PELVIS) 2-3V RIGHT COMPARISON:  None. FINDINGS: There is a comminuted proximal right femoral fracture involving the intratrochanteric region. Mild impaction and angulation is noted at the fracture site. Pelvic ring is  intact. Multiple calcified uterine fibroids are seen. IMPRESSION: Comminuted intratrochanteric right femoral fracture. Electronically Signed   By: Alcide Clever M.D.   On: 02/18/2016 19:26   Dg Femur Port, Min 2 Views Right  02/19/2016  CLINICAL DATA:  Status post right hip fracture repair EXAM: RIGHT FEMUR PORTABLE 1 VIEW COMPARISON:  February 18, 2016 FINDINGS: A gamma nail and rod have been placed across the intertrochanteric fracture. Alignment is improved. The rod is fixed distally with a single screw. No dislocation. Skin staples are seen distally and proximally. IMPRESSION: Status post right hip fracture repair. Hardware is in good position. Electronically Signed   By: Gerome Sam III M.D   On: 02/19/2016 17:29    Assessment/Plan: 1 Day Post-Op   Principal Problem:   Fracture, femur, intertrochanteric (HCC) Active Problems:   Alzheimer's dementia   HTN (hypertension)   Hypothyroidism   HLD (hyperlipidemia)   Pressure ulcer  Patient is doing well postop. Her vital signs are stable other than a low-grade temperature elevation. Patient's hemoglobin and hematocrit remained stable. She will complete 24 hours of postoperative antibiotics.    Juanell Fairly , MD 02/20/2016, 10:52 AM

## 2016-02-20 NOTE — Evaluation (Signed)
Physical Therapy Evaluation Patient Details Name: Cassandra Steele MRN: [id number] DOB: 02/24/1936 Today's Date: 02/20/2016   History of Present Illness  Pt apparently fell out of her w/c at a group home where she is a total care resident.  Pt fractured her R hip requiring ORIF.  Pt with baseline dementia, PT treatment difficult.  Clinical Impression  Pt extremely limited with what she is able to do with PT.  Pt occasionally able to open eyes, but is not able to even say her name and other then a few rambling "words" mostly groans and mumbles t/o light PROM exercises in bed.  Pt clearly in pain with even very minimal movement, will be a tough course of rehab.    Follow Up Recommendations SNF    Equipment Recommendations       Recommendations for Other Services       Precautions / Restrictions Precautions Precautions: Fall Precaution Comments: contact isolation precautions Restrictions Weight Bearing Restrictions: Yes RLE Weight Bearing: Weight bearing as tolerated      Mobility  Bed Mobility               General bed mobility comments: Pt resistant to even basic in-bed exercises and groaning in pain with all acts.  Bed mobility deferred  Transfers                    Ambulation/Gait                Stairs            Wheelchair Mobility    Modified Rankin (Stroke Patients Only)       Balance                                             Pertinent Vitals/Pain Pain Assessment:  (unable to rate but clearly very uncomfortable with all mvt)    Home Living Family/patient expects to be discharged to:: Skilled nursing facility                 Additional Comments: From [facility] - family care home per medical record    Prior Function Level of Independence: Needs assistance         Comments: apparently she was total care     Hand Dominance        Extremity/Trunk Assessment   Upper Extremity  Assessment: Generalized weakness (unable to follow instruction)           Lower Extremity Assessment: Generalized weakness (unable to participate with testing, R very pain limited)         Communication   Communication:  (pt only groaning/mumbling t/o exam, only mumbing "words")  Cognition Arousal/Alertness:  (hardly able to open eyes) Behavior During Therapy: Flat affect Overall Cognitive Status: History of cognitive impairments - at baseline                      General Comments      Exercises General Exercises - Lower Extremity Ankle Circles/Pumps: PROM;10 reps Heel Slides: PROM;10 reps Hip ABduction/ADduction: PROM;10 reps      Assessment/Plan    PT Assessment Patient needs continued PT services  PT Diagnosis Difficulty walking;Generalized weakness;Acute pain   PT Problem List Decreased strength;Decreased cognition;Decreased mobility;Decreased balance;Decreased activity tolerance;Decreased range of motion;Decreased knowledge of use of DME;Decreased safety awareness;Pain  PT Treatment  Interventions Gait training;Functional mobility training;Therapeutic activities;Therapeutic exercise;Balance training;Neuromuscular re-education;Cognitive remediation   PT Goals (Current goals can be found in the Care Plan section) Acute Rehab PT Goals Patient Stated Goal: unable to state PT Goal Formulation: Patient unable to participate in goal setting Time For Goal Achievement: 03/05/16 Potential to Achieve Goals: Poor    Frequency 7X/week   Barriers to discharge        Co-evaluation               End of Session   Activity Tolerance: Patient limited by pain;Treatment limited secondary to agitation (limited due to mental status) Patient left: with bed alarm set;with call bell/phone within reach Nurse Communication: Mobility status         Time: 1204-1224 PT Time Calculation (min) (ACUTE ONLY): 20 min   Charges:   PT Evaluation $PT Eval Low Complexity:  1 Procedure     PT G Codes:       Loran Senters, PT, DPT [phone]  Malachi Pro 02/20/2016, 2:30 PM

## 2016-02-20 NOTE — Progress Notes (Signed)
Anticoagulation monitoring(Lovenox):  79yo F ordered Lovenox 30 mg Q24h  Filed Weights   02/18/16 1828 02/18/16 2239  Weight: 140 lb (63.504 kg) 133 lb 6.4 oz (60.51 kg)   BMI 23.7   Lab Results  Component Value Date   CREATININE 0.69 02/20/2016   CREATININE 0.65 02/19/2016   CREATININE 0.74 02/18/2016   Estimated Creatinine Clearance: 48.7 mL/min (by C-G formula based on Cr of 0.69). Hemoglobin & Hematocrit     Component Value Date/Time   HGB 11.2* 02/20/2016 0458   HGB 12.2 02/28/2015 1521   HCT 32.0* 02/20/2016 0458   HCT 36.4 02/28/2015 1521     Per Protocol for Patient with estCrcl > 30 ml/min and BMI < 40, will transition to Lovenox 40 mg Q24h with next dose     Bari MantisKristin Mykel Sponaugle PharmD Clinical Pharmacist 02/20/2016

## 2016-02-20 NOTE — Care Management Note (Signed)
Case Management Note  Patient Details  Name: Cassandra Steele MRN: [id number] Date of Birth: 06/17/1936  Subjective/Objective:    79yo [name] was admitted from [facility] after a fall. 02/19/16 Right hip pinning by Dr Martha ClanKrasinski. Hx: Alzheimers, anemia, HTN, Incontinence, Hypothyroidism, frequent falls. Next of kin is apparently her sister. [facility] follows [name] [facility] ([facility]=PT is recommending SNF. Social Work consult pending for placement needs.                Action/Plan:   Expected Discharge Date:                  Expected Discharge Plan:     In-House Referral:     Discharge planning Services     Post Acute Care Choice:    Choice offered to:     DME Arranged:    DME Agency:     HH Arranged:    HH Agency:     Status of Service:     Medicare Important Message Given:    Date Medicare IM Given:    Medicare IM give by:    Date Additional Medicare IM Given:    Additional Medicare Important Message give by:     If discussed at Long Length of Stay Meetings, dates discussed:    Additional Comments:  Tiye Huwe A, RN 02/20/2016, 3:56 PM

## 2016-02-20 NOTE — Progress Notes (Signed)
Pt has not voided since her foley came out this morning. Pt bladder scanned, 319 ml. MD Gouru notified. Orders received for 500 cc NS fluid bolus.  In and Out X1 when bladder scan is greater than 400 ml.  MD also given urine culture results.  Per MD she will look at it and place orders.

## 2016-02-21 ENCOUNTER — Encounter: Payer: Self-pay | Admitting: Orthopedic Surgery

## 2016-02-21 LAB — CBC WITH DIFFERENTIAL/PLATELET
Basophils Absolute: 0.1 10*3/uL (ref 0–0.1)
Basophils Relative: 1 %
Eosinophils Absolute: 0.1 10*3/uL (ref 0–0.7)
Eosinophils Relative: 1 %
HEMATOCRIT: 25.9 % — AB (ref 35.0–47.0)
HEMOGLOBIN: 9 g/dL — AB (ref 12.0–16.0)
LYMPHS ABS: 2.3 10*3/uL (ref 1.0–3.6)
LYMPHS PCT: 19 %
MCH: 30.6 pg (ref 26.0–34.0)
MCHC: 34.6 g/dL (ref 32.0–36.0)
MCV: 88.4 fL (ref 80.0–100.0)
MONO ABS: 1 10*3/uL — AB (ref 0.2–0.9)
MONOS PCT: 8 %
NEUTROS ABS: 9 10*3/uL — AB (ref 1.4–6.5)
NEUTROS PCT: 71 %
Platelets: 180 10*3/uL (ref 150–440)
RBC: 2.93 MIL/uL — ABNORMAL LOW (ref 3.80–5.20)
RDW: 13 % (ref 11.5–14.5)
WBC: 12.4 10*3/uL — ABNORMAL HIGH (ref 3.6–11.0)

## 2016-02-21 LAB — URINE CULTURE

## 2016-02-21 LAB — BASIC METABOLIC PANEL
Anion gap: 5 (ref 5–15)
BUN: 9 mg/dL (ref 6–20)
CALCIUM: 8.8 mg/dL — AB (ref 8.9–10.3)
CHLORIDE: 107 mmol/L (ref 101–111)
CO2: 21 mmol/L — AB (ref 22–32)
CREATININE: 0.61 mg/dL (ref 0.44–1.00)
GFR calc Af Amer: >60 mL/min (ref >60)
GFR calc non Af Amer: >60 mL/min (ref >60)
GLUCOSE: 126 mg/dL — AB (ref 65–99)
Potassium: 3.4 mmol/L — ABNORMAL LOW (ref 3.5–5.1)
Sodium: 133 mmol/L — ABNORMAL LOW (ref 135–145)

## 2016-02-21 MED ORDER — ENOXAPARIN SODIUM 40 MG/0.4ML ~~LOC~~ SOLN: 40.0000 mg | SUBCUTANEOUS | Status: AC

## 2016-02-21 MED ORDER — POTASSIUM CHLORIDE 20 MEQ PO PACK
40.0000 meq | PACK | Freq: Once | ORAL | Status: AC
Start: 2016-02-21 — End: 2016-02-21
  Administered 2016-02-21: 40 meq via ORAL
  Filled 2016-02-21: qty 2

## 2016-02-21 MED ORDER — LORAZEPAM 0.5 MG PO TABS: 0.5000 mg | ORAL_TABLET | Freq: Three times a day (TID) | ORAL | Status: AC | PRN

## 2016-02-21 MED ORDER — BISACODYL 10 MG RE SUPP: 10.0000 mg | Freq: Once | RECTAL | Status: DC

## 2016-02-21 MED ORDER — CIPROFLOXACIN HCL 500 MG PO TABS: 500.0000 mg | ORAL_TABLET | Freq: Two times a day (BID) | ORAL | Status: AC

## 2016-02-21 MED ORDER — FERROUS SULFATE 325 (65 FE) MG PO TABS: 325.0000 mg | ORAL_TABLET | Freq: Three times a day (TID) | ORAL | Status: AC

## 2016-02-21 MED ORDER — HALOPERIDOL 0.5 MG PO TABS: 0.5000 mg | ORAL_TABLET | Freq: Every day | ORAL | Status: AC

## 2016-02-21 MED ORDER — ACETAMINOPHEN 325 MG PO TABS: 650.0000 mg | ORAL_TABLET | Freq: Four times a day (QID) | ORAL | Status: AC | PRN

## 2016-02-21 MED ORDER — HYDROCODONE-ACETAMINOPHEN 5-325 MG PO TABS: 1.0000 | ORAL_TABLET | Freq: Four times a day (QID) | ORAL | Status: AC | PRN

## 2016-02-21 NOTE — Progress Notes (Signed)
DISCHARGE NOTE:  Report called to [facility] LPN at [facility]. EMS called for tranportation. Pt ready and waiting on transportation.

## 2016-02-21 NOTE — Progress Notes (Addendum)
LCSW spoke to EVA patients sister who has agreed to [facility]. Patient is medically stable for [facility]. Per [facility] will go to [facility] 30. RN will call report and arrange EMS for transport. LCSW sent D/C Summary, FL2 and D/C Packet to [facility] via [facility]. Patient is aware of above. Patient's sister [name] is aware of above. [facility] is aware of above. Patient is going to rehab under skilled days and will discharge hospice services. Please reconsult if future social work needs arise. CSW signing off.   Jetta LoutBailey Morgan, LCSW [phone]

## 2016-02-21 NOTE — Care Management Important Message (Signed)
Important Message  Patient Details  Name: Ferrel LoganFrances Tindol MRN: [id number] Date of Birth: 05/12/1936   Medicare Important Message Given:  Yes    Collie SiadAngela Leoda Smithhart, RN 02/21/2016, 9:43 AM

## 2016-02-21 NOTE — Progress Notes (Signed)
Visit made. Patient is currently followed [facility] [name] at [facility] with a  Hospice diagnosis of Alzheimer's disease. She is a DNR code. [facility] after a [facility], found to have a right hip fracture. she has undergone right hip repair surgery Patient seen lying in bed, post op day 3, eyes closed, oriented to self only. She was tearful with interaction. Per chart note review she has required two doses of PRN morphine for pain. She appeared uncomfortable and distressed, reported to staff RN Ana. Plan is for discharge today to [facility] for [facility]. Writer contacted her sister Ms. Laureen OchsCurrie, she is unable to come to the hospital to sign the revocation of hospice benefit form, hospice team made aware and will handle. Updated notes faxed to triage. Thank you. Dayna BarkerKaren Robertson RN, BSN, [name], [place] Lia[place] [phone] c

## 2016-02-21 NOTE — Progress Notes (Signed)
Physical Therapy Treatment Patient Details Name: Cassandra Steele MRN: [id number] DOB: November 30, 1935 Today's Date: 02/21/2016    History of Present Illness Pt sustained a fall from w/c at a group home where she is a total care resident.  Pt fractured her R hip --> ORIF.  Pt with baseline dementia, limited ability to participate c PT at eval and POD2. Continuing of trial to see if pt is appropriate for PT skilled services.     PT Comments    Pt continues to demonstrate AMS and lethargy, with severe language deficits at this time. Pt tolerating PROM of RLE a little better today, but remains very limited. Will continue to work with patient again to determine ability to participate in skilled rehab. At this point limited baseline information is available, and it is difficult to ascertain the nature of the patient's current state (transient v chronic), and whether it is likely to improve as the effects of anesthesia become less pronounced.    Follow Up Recommendations  SNF     Equipment Recommendations       Recommendations for Other Services       Precautions / Restrictions Precautions Precautions: Fall Precaution Comments: contact isolation precautions Restrictions Weight Bearing Restrictions: Yes RLE Weight Bearing: Weight bearing as tolerated    Mobility  Bed Mobility               General bed mobility comments: Not appropriate for bed mobility at this time due to lethargy/somnolence, not following commands.    Transfers                    Ambulation/Gait                 Stairs            Wheelchair Mobility    Modified Rankin (Stroke Patients Only)       Balance                                    Cognition Arousal/Alertness: Lethargic   Overall Cognitive Status: History of cognitive impairments - at baseline (disoriented, severe language deficits, does not respond to language or gentle touch. )                       Exercises General Exercises - Lower Extremity Ankle Circles/Pumps: PROM;20 reps;Right;Supine Heel Slides: PROM;15 reps;Right;Supine Hip ABduction/ADduction: PROM;Right;15 reps;Supine    General Comments        Pertinent Vitals/Pain Pain Assessment: Faces (Pt grimaces with PROM, but has language deficits and AMS, unable to rate. ) Faces Pain Scale: Hurts whole lot Pain Intervention(s): Limited activity within patient's tolerance;Monitored during session    Home Living                      Prior Function            PT Goals (current goals can now be found in the care plan section) Acute Rehab PT Goals Patient Stated Goal: unable to state PT Goal Formulation: Patient unable to participate in goal setting Progress towards PT goals: Not progressing toward goals - comment    Frequency  7X/week    PT Plan Current plan remains appropriate    Co-evaluation             End of Session   Activity Tolerance: Patient limited by pain;Patient limited  by lethargy Patient left: with bed alarm set;with call bell/phone within reach;in bed     Time: 0930-0940 PT Time Calculation (min) (ACUTE ONLY): 10 min  Charges:  $Therapeutic Activity: 8-22 mins                    G Codes:     9:53 AM, 02/21/2016 Rosamaria LintsAllan C Buccola, PT, DPT PRN Physical Therapist - [facility] License # [id number] [phone](ASCOM)  [phone] (mobile)

## 2016-02-21 NOTE — [place] FL2 (Signed)
MEDICAID FL2 LEVEL OF CARE SCREENING TOOL     IDENTIFICATION  Patient Name: Cassandra Steele Birthdate: 07/31/1936 Sex: female Admission Date (Current Location): 02/18/2016  [facility] [place]  [place]  ([id number]) Facility and Address:  [facility], [address], [place], [place] [address]      Provider Number: [id number]  Attending Physician Name and Address:  Ramonita LabAruna Sanyla Summey, MD  Relative Name and Phone Number:       Current Level of Care: Hospital Recommended Level of Care: Skilled Nursing Facility Prior Approval Number:    Date Approved/Denied:   PASRR Number:  ( [id number] )  Discharge Plan: SNF    Current Diagnoses: Patient Active Problem List   Diagnosis Date Noted  . Pressure ulcer 02/19/2016  . Fracture, femur, intertrochanteric (HCC) 02/18/2016  . HLD (hyperlipidemia) 02/18/2016  . Abdominal pain 10/08/2015  . Constipation 10/08/2015  . Ileus (HCC) 10/08/2015  . Alzheimer's dementia 10/08/2015  . HTN (hypertension) 10/08/2015  . Hypothyroidism 10/08/2015  . UTI (lower urinary tract infection)   . SIRS (systemic inflammatory response syndrome) (HCC) 10/03/2015  . Recurrent UTI 10/03/2015  . Sepsis (HCC) 04/18/2015  . Acute UTI 04/18/2015  . Delirium superimposed on dementia 04/18/2015  . HTN, goal below 140/90 04/18/2015    Orientation RESPIRATION BLADDER Height & Weight      (Disoriented X4)  Normal Incontinent Weight: 133 lb 6.4 oz (60.51 kg) Height:  5\' 3"  (160 cm)  BEHAVIORAL SYMPTOMS/MOOD NEUROLOGICAL BOWEL NUTRITION STATUS   (none )  (none ) Continent Diet (Diet: Regular )  AMBULATORY STATUS COMMUNICATION OF NEEDS Skin   Extensive Assist Verbally Surgical wounds, PU Stage and Appropriate Care (Incision: Right Hip. Unstageable Pressure Ulcer on Left Heel. )                       Personal Care Assistance Level of Assistance  Bathing, Feeding, Dressing Bathing Assistance: Maximum  assistance Feeding assistance: Maximum assistance Dressing Assistance: Maximum assistance     Functional Limitations Info  Sight, Hearing, Speech Sight Info: Adequate Hearing Info: Adequate Speech Info: Adequate    SPECIAL CARE FACTORS FREQUENCY  PT (By licensed PT), OT (By licensed OT)     PT Frequency:  (5) OT Frequency:  (5)            Contractures      Additional Factors Info  Code Status, Allergies, Psychotropic, Isolation Precautions Code Status Info:  (DNR ) Allergies Info:  (No Known Allergies. ) Psychotropic Info:  (Zoloft)   Isolation Precautions Info:  (10/03/15 ESBL in Urine. )     Current Medications (02/21/2016):  This is the current hospital active medication list Current Facility-Administered Medications  Medication Dose Route Frequency Provider Last Rate Last Dose  . 0.9 %  sodium chloride infusion  75 mL/hr Intravenous Continuous Juanell FairlyKevin Krasinski, MD 75 mL/hr at 02/20/16 0800 75 mL/hr at 02/20/16 0800  . acetaminophen (TYLENOL) tablet 650 mg  650 mg Oral Q6H PRN Juanell FairlyKevin Krasinski, MD   650 mg at 02/20/16 1927   Or  . acetaminophen (TYLENOL) suppository 650 mg  650 mg Rectal Q6H PRN Juanell FairlyKevin Krasinski, MD      . alum & mag hydroxide-simeth (MAALOX/MYLANTA) 200-200-20 MG/5ML suspension 30 mL  30 mL Oral Q4H PRN Juanell FairlyKevin Krasinski, MD      . aspirin chewable tablet 81 mg  81 mg Oral Daily Oralia Manisavid Willis, MD   81 mg at 02/20/16 0918  . bisacodyl (DULCOLAX) suppository  10 mg  10 mg Rectal Daily PRN Juanell Fairly, MD      . cefTRIAXone (ROCEPHIN) 1 g in dextrose 5 % 50 mL IVPB  1 g Intravenous Q24H Ramonita Lab, MD   1 g at 02/20/16 1815  . docusate sodium (COLACE) capsule 100 mg  100 mg Oral BID Juanell Fairly, MD   100 mg at 02/19/16 2029  . enoxaparin (LOVENOX) injection 40 mg  40 mg Subcutaneous Q24H Juanell Fairly, MD      . ferrous sulfate tablet 325 mg  325 mg Oral TID PC Juanell Fairly, MD   325 mg at 02/21/16 0902  . HYDROcodone-acetaminophen  (NORCO/VICODIN) 5-325 MG per tablet 1-2 tablet  1-2 tablet Oral Q6H PRN Juanell Fairly, MD      . hydrOXYzine (ATARAX/VISTARIL) tablet 25 mg  25 mg Oral Q6H PRN Oralia Manis, MD      . levothyroxine (SYNTHROID, LEVOTHROID) tablet 75 mcg  75 mcg Oral QAC breakfast Oralia Manis, MD   75 mcg at 02/19/16 0746  . LORazepam (ATIVAN) tablet 0.5 mg  0.5 mg Oral Q8H PRN Oralia Manis, MD      . magnesium citrate solution 1 Bottle  1 Bottle Oral Once PRN Juanell Fairly, MD      . menthol-cetylpyridinium (CEPACOL) lozenge 3 mg  1 lozenge Oral PRN Juanell Fairly, MD       Or  . phenol (CHLORASEPTIC) mouth spray 1 spray  1 spray Mouth/Throat PRN Juanell Fairly, MD      . morphine 2 MG/ML injection 2 mg  2 mg Intravenous Q2H PRN Juanell Fairly, MD   2 mg at 02/20/16 0202  . ondansetron (ZOFRAN) tablet 4 mg  4 mg Oral Q6H PRN Juanell Fairly, MD       Or  . ondansetron [facility]) injection 4 mg  4 mg Intravenous Q6H PRN Juanell Fairly, MD      . polyethylene glycol (MIRALAX / GLYCOLAX) packet 17 g  17 g Oral Daily PRN Juanell Fairly, MD      . pravastatin (PRAVACHOL) tablet 10 mg  10 mg Oral QHS Oralia Manis, MD   10 mg at 02/19/16 2029  . senna (SENOKOT) tablet 17.2 mg  2 tablet Oral BID Oralia Manis, MD   17.2 mg at 02/20/16 8119  . sertraline (ZOLOFT) tablet 50 mg  50 mg Oral Daily Oralia Manis, MD   50 mg at 02/20/16 0918  . sodium chloride flush (NS) 0.9 % injection 3 mL  3 mL Intravenous Q12H Oralia Manis, MD   3 mL at 02/18/16 2306     Discharge Medications: Please see discharge summary for a list of discharge medications.  Relevant Imaging Results:  Relevant Lab Results:   Additional Information  (SSN: [id number])  Haig Prophet, LCSW

## 2016-02-21 NOTE — Progress Notes (Signed)
EMS here to transport pt. 

## 2016-02-21 NOTE — Progress Notes (Signed)
Subjective:  Postoperatively #2 status post intramedullary fixation for right intertrochanteric hip fracture. Patient is confused and unable to provide history. She appears comfortable lying in bed.   Objective:   VITALS:   Filed Vitals:   02/20/16 2118 02/21/16 0250 02/21/16 0251 02/21/16 0700  BP: 136/62 104/70  121/68  Pulse: 101 101  106  Temp: 97.7 F (36.5 C)  97.8 F (36.6 C) 100 F (37.8 C)  TempSrc: Oral  Oral Axillary  Resp: 20 18  18   Height:      Weight:      SpO2: 100% 100%  100%    PHYSICAL EXAM:   Right lower extremity: Patient's dressings are clean and dry. Her thigh compartments are soft and compressible. She has palpable pedal pulses. Motor and sensory function cannot be assessed as the patient's dementia and inability to cooperate with exam.   LABS  Results for orders placed or performed during the hospital encounter of 02/18/16 (from the past 24 hour(s))  CBC with Differential/Platelet     Status: Abnormal   Collection Time: 02/21/16  5:28 AM  Result Value Ref Range   WBC 12.4 (H) 3.6 - 11.0 K/uL   RBC 2.93 (L) 3.80 - 5.20 MIL/uL   Hemoglobin 9.0 (L) 12.0 - 16.0 g/dL   HCT 16.125.9 (L) 09.635.0 - 04.547.0 %   MCV 88.4 80.0 - 100.0 fL   MCH 30.6 26.0 - 34.0 pg   MCHC 34.6 32.0 - 36.0 g/dL   RDW 40.913.0 81.111.5 - 91.414.5 %   Platelets 180 150 - 440 K/uL   Neutrophils Relative % 71 %   Neutro Abs 9.0 (H) 1.4 - 6.5 K/uL   Lymphocytes Relative 19 %   Lymphs Abs 2.3 1.0 - 3.6 K/uL   Monocytes Relative 8 %   Monocytes Absolute 1.0 (H) 0.2 - 0.9 K/uL   Eosinophils Relative 1 %   Eosinophils Absolute 0.1 0 - 0.7 K/uL   Basophils Relative 1 %   Basophils Absolute 0.1 0 - 0.1 K/uL  Basic metabolic panel     Status: Abnormal   Collection Time: 02/21/16  5:28 AM  Result Value Ref Range   Sodium 133 (L) 135 - 145 mmol/L   Potassium 3.4 (L) 3.5 - 5.1 mmol/L   Chloride 107 101 - 111 mmol/L   CO2 21 (L) 22 - 32 mmol/L   Glucose, Bld 126 (H) 65 - 99 mg/dL   BUN 9 6 - 20  mg/dL   Creatinine, Ser 7.820.61 0.44 - 1.00 mg/dL   Calcium 8.8 (L) 8.9 - 10.3 mg/dL   GFR calc non Af Amer >60 >60 mL/min   GFR calc Af Amer >60 >60 mL/min   Anion gap 5 5 - 15    Dg Hip Operative Unilat W Or W/o Pelvis Right  02/19/2016  CLINICAL DATA:  ORIF intertrochanteric right femoral neck fracture with intramedullary nail and compression screw. Intraoperative imaging. EXAM: OPERATIVE RIGHT HIP (WITH PELVIS IF PERFORMED) 2 VIEWS TECHNIQUE: Fluoroscopic spot image(s) were submitted for interpretation post-operatively. COMPARISON:  Preoperative right hip x-rays yesterday. FINDINGS: ORIF of the intertrochanteric right femoral neck fracture with near anatomic alignment. No visible complicating features. IMPRESSION: Near anatomic alignment post ORIF of the intertrochanteric right femoral neck fracture without visible complications. Electronically Signed   By: Hulan Saashomas  Lawrence M.D.   On: 02/19/2016 16:28   Dg Femur Port, Min 2 Views Right  02/19/2016  CLINICAL DATA:  Status post right hip fracture repair EXAM: RIGHT FEMUR PORTABLE 1  VIEW COMPARISON:  February 18, 2016 FINDINGS: A gamma nail and rod have been placed across the intertrochanteric fracture. Alignment is improved. The rod is fixed distally with a single screw. No dislocation. Skin staples are seen distally and proximally. IMPRESSION: Status post right hip fracture repair. Hardware is in good position. Electronically Signed   By: Gerome Sam III M.D   On: 02/19/2016 17:29    Assessment/Plan: 2 Days Post-Op   Principal Problem:   Fracture, femur, intertrochanteric (HCC) Active Problems:   Alzheimer's dementia   HTN (hypertension)   Hypothyroidism   HLD (hyperlipidemia)   Pressure ulcer  Patient is stable postop. She is okay for discharge to skilled nursing facility today from an orthopedic standpoint. Given her dementia will be difficult for her to participate with physical therapy. She is going to be weightbearing as tolerated and  if she does get up she should use the assistance of a walker. Otherwise prolonged distance amputation she will require wheelchair with the right leg support. I will see the patient back in follow-up in approximately 2 weeks in the office for wound check and staple removal.    Juanell Fairly , MD 02/21/2016, 1:58 PM

## 2016-02-21 NOTE — Evaluation (Signed)
Occupational Therapy Evaluation Patient Details Name: Arieona Swaggerty MRN: [id number] DOB: [birth year] Today's Date: 02/21/2016    History of Present Illness Pt sustained a fall from w/c at a group home where she is a total care resident.  Pt fractured her R hip --> ORIF.    Clinical Impression   Pt.was admitted to [facility] and underwent an ORIF secondary to a Right Hip Fracture. Pt. has a history of Advanced Dementia. Pt. was previously total care with ADLs/IADLs, and was W/C bound. Pt. Was unable to follow simple one step commands and directions for hand to face patterns. Pt. Could benefit from skilled OT intervention BADL retraining.    Follow Up Recommendations  SNF    Equipment Recommendations       Recommendations for Other Services PT consult     Precautions / Restrictions Precautions Precautions: Fall Precaution Comments: contact isolation precautions Restrictions Weight Bearing Restrictions: Yes RLE Weight Bearing: Weight bearing as tolerated      Mobility Bed Mobility               General bed mobility comments: Not appropriate for bed mobility at this time due to lethargy/somnolence, not following commands.    Transfers                      Balance                                            ADL Overall ADL's : Needs assistance/impaired Eating/Feeding: Maximal assistance   Grooming: Maximal assistance   Upper Body Bathing: Total assistance   Lower Body Bathing: Total assistance   Upper Body Dressing : Total assistance   Lower Body Dressing: Total assistance                    Vision     Perception     Praxis      Pertinent Vitals/Pain Pain Assessment: Faces Faces Pain Scale: Hurts whole lot Pain Intervention(s): Limited activity within patient's tolerance;Monitored during session     Hand Dominance     Extremity/Trunk Assessment Upper Extremity Assessment Upper Extremity Assessment: Generalized  weakness (Unable to formally assess secondary to cognition.)           Communication     Cognition Arousal/Alertness: Lethargic Behavior During Therapy: Flat affect Overall Cognitive Status: History of cognitive impairments - at baseline                     General Comments       Exercises       Shoulder Instructions      Home Living Family/patient expects to be discharged to:: Skilled nursing facility                                 Additional Comments: From [facility] - family care home per medical record      Prior Functioning/Environment Level of Independence: Needs assistance        Comments: Total care. W/C bound.    OT Diagnosis:     OT Problem List: Decreased strength;Decreased activity tolerance;Decreased cognition;Decreased knowledge of use of DME or AE;Decreased safety awareness   OT Treatment/Interventions:      OT Goals(Current goals can be found in the care plan section) Acute  Rehab OT Goals Patient Stated Goal: unable to state  OT Frequency:     Barriers to D/C:            Co-evaluation              End of Session    Activity Tolerance: Patient limited by pain;Other (comment) (Cognition, Advanced Dementia) Patient left:     Time: 0905-0920 OT Time Calculation (min): 15 min Charges:  OT General Charges $OT Visit: 1 Procedure OT Evaluation $OT Eval Low Complexity: 1 Procedure G-Codes:    Olegario MessierElaine Monchel Pollitt, MS, OTR/L Olegario MessierElaine Marylou Wages 02/21/2016, 10:15 AM

## 2016-02-21 NOTE — Discharge Summary (Signed)
[facility] - [place] at [facility]   PATIENT NAME: Cassandra Steele    MR#:  [id number]  DATE OF BIRTH:  October 29, 1936  DATE OF ADMISSION:  02/18/2016 ADMITTING PHYSICIAN: Oralia Manis, MD  DATE OF DISCHARGE: 02/21/2016 PRIMARY CARE PHYSICIAN: Lyndon Code, MD    ADMISSION DIAGNOSIS:  Hip fracture, right, closed, initial encounter (HCC) [S72.001A] Hip fx, right, closed, initial encounter (HCC) [S72.001A]  DISCHARGE DIAGNOSIS:  Right hip fracture status post open reduction and internal fixation Acute cystitis with klebsiella  SECONDARY DIAGNOSIS:   Past Medical History  Diagnosis Date  . Hypertension   . Dementia   . Alzheimer's disease   . Thyroid disease   . Anemia   . Osteoarthritis   . Depression   . Mixed incontinence     HOSPITAL COURSE:   # Fracture, rt femur, intertrochanteric (HCC) - POD #2 Per ortho  IV morphine prn ordered for pain control. We will discharge SNF with Norco or Tylenol as needed s/p intramedullary fixation 4/8 DVT px with lovenox 40 sq  # Alzheimer's dementia - continue behavioral meds at home doses  #Acute cystitis -urine culture with >100,000 colonies of gm neg bacteria. Klebsiella  We gave  IV Rocephin and d/c pt with cipro  D/ced foley cath  # HTN (hypertension) - stable, continue home meds  # Hypothyroidism - continue home dose thyroid replacement  #HLD (hyperlipidemia) - continue home dose statin  DISCHARGE CONDITIONS:   Fair  CONSULTS OBTAINED:  Treatment Team:  Juanell Fairly, MD   PROCEDURES open reduction internal fixation of the right hip fracture  DRUG ALLERGIES:  No Known Allergies  DISCHARGE MEDICATIONS:   Current Discharge Medication List    START taking these medications   Details  ciprofloxacin (CIPRO) 500 MG tablet Take 1 tablet (500 mg total) by mouth 2 (two) times daily. Qty: 14 tablet, Refills: 0    enoxaparin (LOVENOX) 40 MG/0.4ML injection Inject 0.4 mLs (40 mg total)  into the skin daily. Qty: 14 Syringe, Refills: 0    ferrous sulfate 325 (65 FE) MG tablet Take 1 tablet (325 mg total) by mouth 3 (three) times daily after meals.      CONTINUE these medications which have CHANGED   Details  acetaminophen (TYLENOL) 325 MG tablet Take 2 tablets (650 mg total) by mouth every 6 (six) hours as needed for mild pain (or Fever >/= 101).    haloperidol (HALDOL) 0.5 MG tablet Take 1 tablet (0.5 mg total) by mouth daily. Pt is also able to take every six hours as needed for agitation. Qty: 30 tablet, Refills: 0    HYDROcodone-acetaminophen (NORCO/VICODIN) 5-325 MG tablet Take 1-2 tablets by mouth every 6 (six) hours as needed for moderate pain. Qty: 30 tablet, Refills: 0    LORazepam (ATIVAN) 0.5 MG tablet Take 1 tablet (0.5 mg total) by mouth every 8 (eight) hours as needed (for agitation.). Qty: 30 tablet, Refills: 0      CONTINUE these medications which have NOT CHANGED   Details  aspirin 81 MG chewable tablet Chew 81 mg by mouth daily.    bisacodyl (DULCOLAX) 10 MG suppository Place 10 mg rectally as needed for moderate constipation.    Calcium Carbonate-Vitamin D 600-400 MG-UNIT per tablet Take 1 tablet by mouth 2 (two) times daily.    diclofenac sodium (VOLTAREN) 1 % GEL Apply 2 g topically 2 (two) times daily as needed (for pain). Pt applies to hips and knees.    guaiFENesin-dextromethorphan (ROBITUSSIN DM)  100-10 MG/5ML syrup Take 10 mLs by mouth 3 (three) times daily as needed for cough (and/or congestion).    hydrocortisone 2.5 % cream Apply 1 application topically 2 (two) times daily as needed (for rash).     hydrOXYzine (ATARAX/VISTARIL) 25 MG tablet Take 25 mg by mouth every 6 (six) hours as needed for itching (and/or agitation).     levothyroxine (SYNTHROID, LEVOTHROID) 75 MCG tablet Take 75 mcg by mouth daily before breakfast.     Melatonin 5 MG TABS Take 10 mg by mouth at bedtime.    polyethylene glycol (MIRALAX / GLYCOLAX) packet Take  17 g by mouth daily as needed for moderate constipation.    pravastatin (PRAVACHOL) 10 MG tablet Take 10 mg by mouth at bedtime.    senna (SENOKOT) 8.6 MG tablet Take 2 tablets by mouth 2 (two) times daily.    sertraline (ZOLOFT) 50 MG tablet Take 50 mg by mouth daily.      STOP taking these medications     bisacodyl (DULCOLAX) 5 MG EC tablet          DISCHARGE INSTRUCTIONS:   Activity per PT recommendations Diet low-salt Follow-up with orthopedics Dr. Martha Clan in 1-2 weeks Follow-up with primary care physician at the facility in 3-5 days  DIET:  Low-salt  DISCHARGE CONDITION:  Fair  ACTIVITY:  Activity as tolerated PER orthopedics recommendations  OXYGEN:  Home Oxygen: No.   Oxygen Delivery: room air  DISCHARGE LOCATION:  SNF  If you experience worsening of your admission symptoms, develop shortness of breath, life threatening emergency, suicidal or homicidal thoughts you must seek medical attention immediately by calling 911 or calling your MD immediately  if symptoms less severe.  You Must read complete instructions/literature along with all the possible adverse reactions/side effects for all the Medicines you take and that have been prescribed to you. Take any new Medicines after you have completely understood and accpet all the possible adverse reactions/side effects.   Please note  You were cared for by a hospitalist during your hospital stay. If you have any questions about your discharge medications or the care you received while you were in the hospital after you are discharged, you can call the unit and asked to speak with the hospitalist on call if the hospitalist that took care of you is not available. Once you are discharged, your primary care physician will handle any further medical issues. Please note that NO REFILLS for any discharge medications will be authorized once you are discharged, as it is imperative that you return to your primary care  physician (or establish a relationship with a primary care physician if you do not have one) for your aftercare needs so that they can reassess your need for medications and monitor your lab values.     Today  Chief Complaint  Patient presents with  . Fall  . Hip Pain   Patient is resting comfortably. Demented and unable to get any history. Decreased by mouth intake    VITAL SIGNS:  Blood pressure 121/68, pulse 106, temperature 100 F (37.8 C), temperature source Axillary, resp. rate 18, height 5\' 3"  (1.6 m), weight 60.51 kg (133 lb 6.4 oz), SpO2 100 %.  I/O:    Intake/Output Summary (Last 24 hours) at 02/21/16 1212 Last data filed at 02/21/16 0900  Gross per 24 hour  Intake    180 ml  Output      0 ml  Net    180 ml  PHYSICAL EXAMINATION:  GENERAL:  80 y.o.-year-old patient lying in the bed with no acute distress.  EYES: Pupils equal, round, reactive to light and accommodation. No scleral icterus. Extraocular muscles intact.  HEENT: Head atraumatic, normocephalic. Oropharynx and nasopharynx clear.  NECK:  Supple, no jugular venous distention. No thyroid enlargement, no tenderness.  LUNGS: Normal breath sounds bilaterally, no wheezing, rales,rhonchi or crepitation. No use of accessory muscles of respiration.  CARDIOVASCULAR: S1, S2 normal. No murmurs, rubs, or gallops.  ABDOMEN: Soft, non-tender, non-distended. Bowel sounds present. No organomegaly or mass.  EXTREMITIES: Right hip with honeycomb dressing No pedal edema, cyanosis, or clubbing.  NEUROLOGIC: Patient is demented PSYCHIATRIC: The patient is alert but demented SKIN: No obvious rash, lesion, or ulcer.   DATA REVIEW:   CBC  Recent Labs Lab 02/21/16 0528  WBC 12.4*  HGB 9.0*  HCT 25.9*  PLT 180    Chemistries   Recent Labs Lab 02/21/16 0528  NA 133*  K 3.4*  CL 107  CO2 21*  GLUCOSE 126*  BUN 9  CREATININE 0.61  CALCIUM 8.8*    Cardiac Enzymes No results for input(s): TROPONINI in the  last 168 hours.  Microbiology Results  Results for orders placed or performed during the hospital encounter of 02/18/16  Urine culture     Status: Abnormal   Collection Time: 02/18/16 10:45 PM  Result Value Ref Range Status   Specimen Description URINE, RANDOM  Final   Special Requests NONE  Final   Culture >=100,000 COLONIES/mL KLEBSIELLA PNEUMONIAE (A)  Final   Report Status 02/21/2016 FINAL  Final   Organism ID, Bacteria KLEBSIELLA PNEUMONIAE (A)  Final      Susceptibility   Klebsiella pneumoniae - MIC*    AMPICILLIN >=32 RESISTANT Resistant     CEFAZOLIN <=4 SENSITIVE Sensitive     CEFTRIAXONE <=1 SENSITIVE Sensitive     CIPROFLOXACIN <=0.25 SENSITIVE Sensitive     GENTAMICIN <=1 SENSITIVE Sensitive     IMIPENEM <=0.25 SENSITIVE Sensitive     NITROFURANTOIN 64 INTERMEDIATE Intermediate     TRIMETH/SULFA <=20 SENSITIVE Sensitive     AMPICILLIN/SULBACTAM 4 SENSITIVE Sensitive     PIP/TAZO <=4 SENSITIVE Sensitive     Extended ESBL NEGATIVE Sensitive     * >=100,000 COLONIES/mL KLEBSIELLA PNEUMONIAE  Surgical pcr screen     Status: None   Collection Time: 02/18/16 10:45 PM  Result Value Ref Range Status   MRSA, PCR NEGATIVE NEGATIVE Final   Staphylococcus aureus NEGATIVE NEGATIVE Final    Comment:        The Xpert SA Assay (FDA approved for NASAL specimens in patients over 58 years of age), is one component of a comprehensive surveillance program.  Test performance has been validated by [facility] for patients greater than or equal to 39 year old. It is not intended to diagnose infection nor to guide or monitor treatment.     RADIOLOGY:  Dg Chest 1 View  02/18/2016  CLINICAL DATA:  Recent fall with right hip fracture EXAM: CHEST 1 VIEW COMPARISON:  10/03/2015 FINDINGS: Cardiac shadow is within normal limits. The lungs are well aerated bilaterally. No focal infiltrate is seen. No acute bony abnormality is noted. IMPRESSION: No acute abnormality seen. Electronically  Signed   By: Alcide Clever M.D.   On: 02/18/2016 19:33   Dg Knee Complete 4 Views Right  02/18/2016  CLINICAL DATA:  Recent fall from wheelchair with known hip fracture EXAM: RIGHT KNEE - COMPLETE 4+ VIEW COMPARISON:  None.  FINDINGS: Degenerative changes of the right knee joint are noted in all 3 compartments. No joint effusion is seen. No acute fracture or dislocation is noted. IMPRESSION: Severe degenerative changes without acute abnormality. Electronically Signed   By: Alcide Clever M.D.   On: 02/18/2016 19:32   Dg Hip Operative Unilat W Or W/o Pelvis Right  02/19/2016  CLINICAL DATA:  ORIF intertrochanteric right femoral neck fracture with intramedullary nail and compression screw. Intraoperative imaging. EXAM: OPERATIVE RIGHT HIP (WITH PELVIS IF PERFORMED) 2 VIEWS TECHNIQUE: Fluoroscopic spot image(s) were submitted for interpretation post-operatively. COMPARISON:  Preoperative right hip x-rays yesterday. FINDINGS: ORIF of the intertrochanteric right femoral neck fracture with near anatomic alignment. No visible complicating features. IMPRESSION: Near anatomic alignment post ORIF of the intertrochanteric right femoral neck fracture without visible complications. Electronically Signed   By: Hulan Saas M.D.   On: 02/19/2016 16:28   Dg Hip Unilat With Pelvis 2-3 Views Right  02/18/2016  CLINICAL DATA:  Fall from wheelchair with right hip pain, initial encounter EXAM: DG HIP (WITH OR WITHOUT PELVIS) 2-3V RIGHT COMPARISON:  None. FINDINGS: There is a comminuted proximal right femoral fracture involving the intratrochanteric region. Mild impaction and angulation is noted at the fracture site. Pelvic ring is intact. Multiple calcified uterine fibroids are seen. IMPRESSION: Comminuted intratrochanteric right femoral fracture. Electronically Signed   By: Alcide Clever M.D.   On: 02/18/2016 19:26   Dg Femur Port, Min 2 Views Right  02/19/2016  CLINICAL DATA:  Status post right hip fracture repair EXAM: RIGHT  FEMUR PORTABLE 1 VIEW COMPARISON:  February 18, 2016 FINDINGS: A gamma nail and rod have been placed across the intertrochanteric fracture. Alignment is improved. The rod is fixed distally with a single screw. No dislocation. Skin staples are seen distally and proximally. IMPRESSION: Status post right hip fracture repair. Hardware is in good position. Electronically Signed   By: Gerome Sam III M.D   On: 02/19/2016 17:29    EKG:   Orders placed or performed during the hospital encounter of 02/18/16  . ED EKG  . ED EKG      Management plans discussed with the family and they are in agreement.  CODE STATUS:     Code Status Orders        Start     Ordered   02/19/16 1803  Do not attempt resuscitation (DNR)   Continuous    Question Answer Comment  In the event of cardiac or respiratory ARREST Do not call a "code blue"   In the event of cardiac or respiratory ARREST Do not perform Intubation, CPR, defibrillation or ACLS   In the event of cardiac or respiratory ARREST Use medication by any route, position, wound care, and other measures to relive pain and suffering. May use oxygen, suction and manual treatment of airway obstruction as needed for comfort.      02/19/16 1802    Code Status History    Date Active Date Inactive Code Status Order ID Comments User Context   02/18/2016 10:42 PM 02/19/2016  5:42 PM DNR [id number]  Oralia Manis, MD Inpatient   02/18/2016  9:24 PM 02/18/2016 10:42 PM Full Code [id number]  Juanell Fairly, MD ED   10/09/2015  1:17 AM 10/10/2015  7:20 PM Full Code [id number]  Oralia Manis, MD Inpatient   10/03/2015 11:39 PM 10/06/2015  8:27 PM Full Code [id number]  Marguarite Arbour, MD Inpatient   04/18/2015  4:36 PM 04/21/2015  7:45 PM Full Code [id number]  Marguarite ArbourJeffrey D Sparks, MD Inpatient    Advance Directive Documentation        Most Recent Value   Type of Advance Directive  Out of facility DNR (pink MOST or yellow form)   Pre-existing out of facility DNR order (yellow form  or pink MOST form)     "MOST" Form in Place?        TOTAL TIME TAKING CARE OF THIS PATIENT: 45  minutes.    @[facility] @  on 02/21/2016 at 12:12 PM  Between 7am to 6pm - Pager - [phone]  After 6pm go to [email or web address] - password [email or web address] [email or web address]  [facility]  Office  [phone]  CC: Primary care physician; Lyndon CodeKHAN, FOZIA M, MD

## 2016-02-21 NOTE — Clinical Social Work Placement (Signed)
   CLINICAL SOCIAL WORK PLACEMENT  NOTE  Date:  02/21/2016  Patient Details  Name: Cassandra LoganFrances Dargan MRN: [id number] Date of Birth: 10/04/1936  Clinical Social Work is seeking post-discharge placement for this patient at the Skilled  Nursing Facility level of care (*CSW will initial, date and re-position this form in  chart as items are completed):  Yes   Patient/family provided with [facility] Clinical Social Work Department's list of facilities offering this level of care within the geographic area requested by the patient (or if unable, by the patient's family).  Yes   Patient/family informed of their freedom to choose among providers that offer the needed level of care, that participate in Medicare, Medicaid or managed care program needed by the patient, have an available bed and are willing to accept the patient.  Yes   Patient/family informed of [facility]'s ownership interest in [facility] and [facility], as well as of the fact that they are under no obligation to receive care at these facilities.  PASRR submitted to EDS on       PASRR number received on       Existing PASRR number confirmed on 02/21/16     FL2 transmitted to all facilities in geographic area requested by pt/family on 02/21/16     FL2 transmitted to all facilities within larger geographic area on       Patient informed that his/her managed care company has contracts with or will negotiate with certain facilities, including the following:        Yes   Patient/family informed of bed offers received.  Patient chooses bed at  [facility] )     Physician recommends and patient chooses bed at      Patient to be transferred to  [facility] ) on 02/21/16.  Patient to be transferred to facility by  [facility] )     Patient family notified on 02/21/16 of transfer.  Name of family member notified:   (Patient's sister [name] is aware of D/C today. )     PHYSICIAN        Additional Comment:    _______________________________________________ Haig ProphetMorgan, Christo Hain G, LCSW 02/21/2016, 1:38 PM

## 2016-02-21 NOTE — Clinical Social Work Note (Signed)
Clinical Social Work Assessment  Patient Details  Name: Cassandra Steele MRN: [id number] Date of Birth: [birth year]  Date of referral:  02/21/16               Reason for consult:  Facility Placement                Permission sought to share information with:  Family Supports, Customer service manager Permission granted to share information::  Yes, Verbal Permission Granted  Name::     Cassandra Steele or [place]::  Yes  Relationship::  yes  Contact Information:  Yes  Housing/Transportation Living arrangements for the past 2 months:  [place] of Information:  Other (Comment Required) (sister Cassandra Steele) Patient Interpreter Needed:  None Criminal Activity/Legal Involvement Pertinent to Current Situation/Hospitalization:  No - Comment as needed Significant Relationships:  Other Family Members (sister Cassandra Steele [phone]) Lives with:  Facility Resident Do you feel safe going back to the place where you live?  No Need for family participation in patient care:  Yes (Comment)  Care giving concerns:  Current resident at [name]-needs higher level of care short term rehab   Social Worker assessment / plan: LCSW met with patient and was unable to collect information from her as she has dementia. LCSW called her sister and consulted with Nurse. Data collected to complete assessment and Fl2. Family agrees that she will need a skilled nursing facility for a short term rehab to assist with her Right hip fracture, patient required full assistance with all her ADL's.  She is not oriented to person,place and time and has Dementia. She has good family involvement her sister [name]. Patient is non verbal, and uses a wheel chair and is on a normal diet. There is a DNR order chart. Plan is to have patients information sent to SNF via the hub.  Employment status:  Disabled (Comment on whether or not currently receiving Disability) Insurance information:   Medicaid In [place] ([employer]) PT Recommendations:  [place] / Referral to community resources:  [place]  Patient/Family's Response to care: Needs highter level of care  Patient/Family's Understanding of and Emotional Response to Diagnosis, Current Treatment, and Prognosis:  They understand she needs more assistance  Emotional Assessment Appearance:    stated age Attitude/Demeanor/Rapport:  Unable to Assess Affect (typically observed):  Calm Orientation:   (Dementia) Alcohol / Substance use:  Never Used Psych involvement (Current and /or in the community):  No (Comment)  Discharge Needs  Concerns to be addressed:  No discharge needs identified Readmission within the last 30 days:  No Current discharge risk:  Cognitively Impaired Barriers to Discharge:  Requiring sitter/restraints   Joana Reamer, LCSW 02/21/2016, 9:32 AM

## 2016-02-22 NOTE — Anesthesia Postprocedure Evaluation (Signed)
Anesthesia Post Note  Patient: Cassandra Steele  Procedure(s) Performed: Procedure(s) (LRB): INTRAMEDULLARY (IM) NAIL INTERTROCHANTRIC (Right)  Patient location during evaluation: PACU Anesthesia Type: Spinal Level of consciousness: awake and awake and alert Pain management: pain level controlled Vital Signs Assessment: post-procedure vital signs reviewed and stable Respiratory status: spontaneous breathing Cardiovascular status: blood pressure returned to baseline Anesthetic complications: no    Last Vitals:  Filed Vitals:   02/21/16 1609 02/21/16 1802  BP: 136/72 115/72  Pulse: 111 112  Temp: 36.8 C 37.8 C  Resp: 18 18    Last Pain:  Filed Vitals:   02/21/16 1805  PainSc: Asleep                 Rashika Bettes

## 2017-08-13 IMAGING — CR DG HIP (WITH OR WITHOUT PELVIS) 2-3V*R*
1 series · 4 of 4 positions shown · non-contrast
Comparison: None.

CLINICAL DATA: Fall from wheelchair with right hip pain, initial
encounter

EXAM:
DG HIP (WITH OR WITHOUT PELVIS) 2-3V RIGHT

[Series 1: t pelvis ap · 0.14mm/px · 4 of 4 slices shown]
[im 1/4]
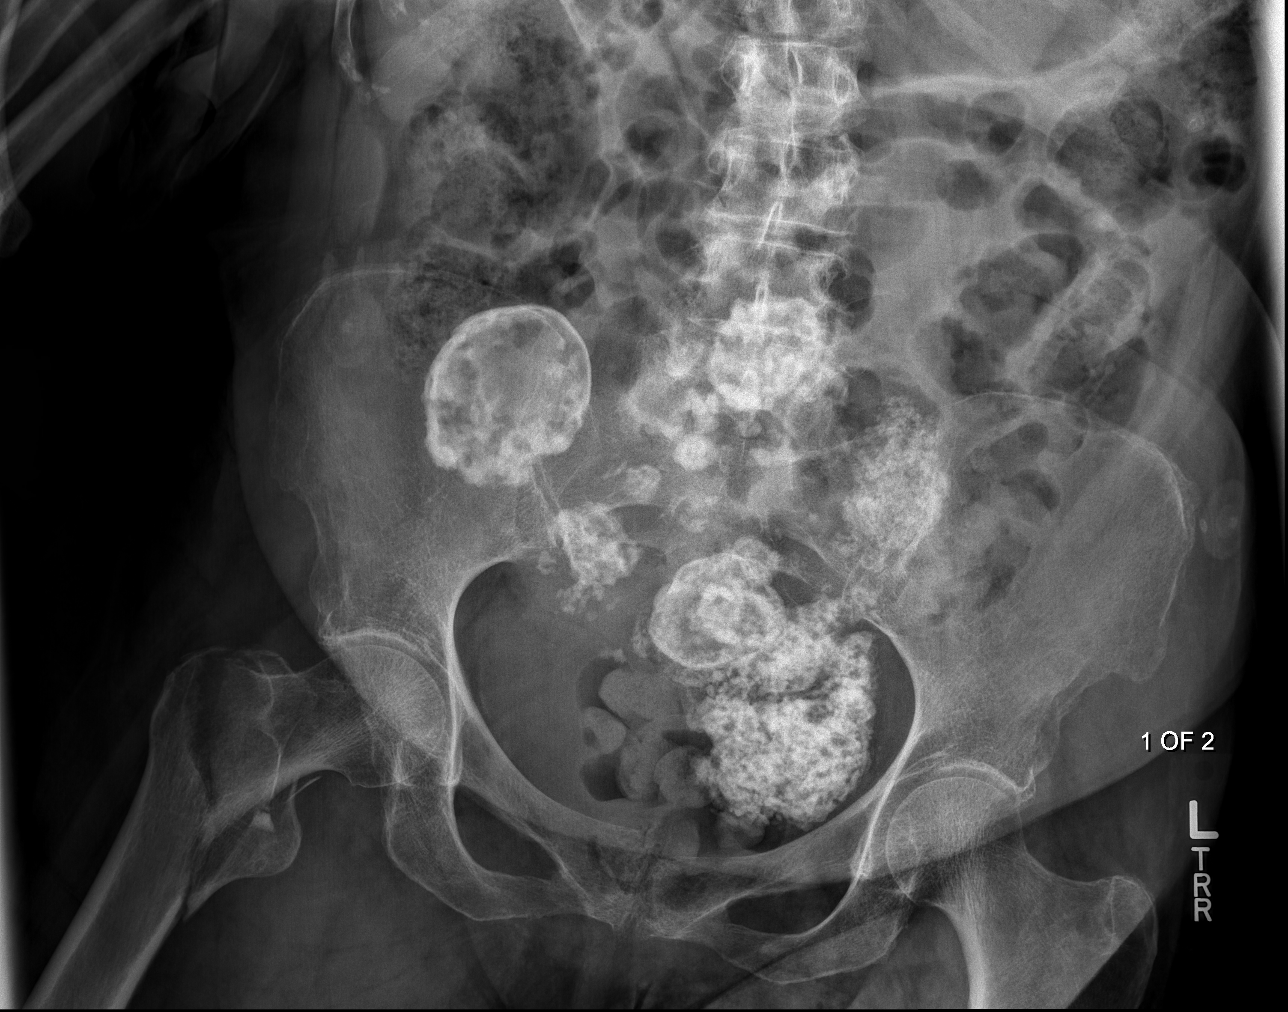
[im 2/4]
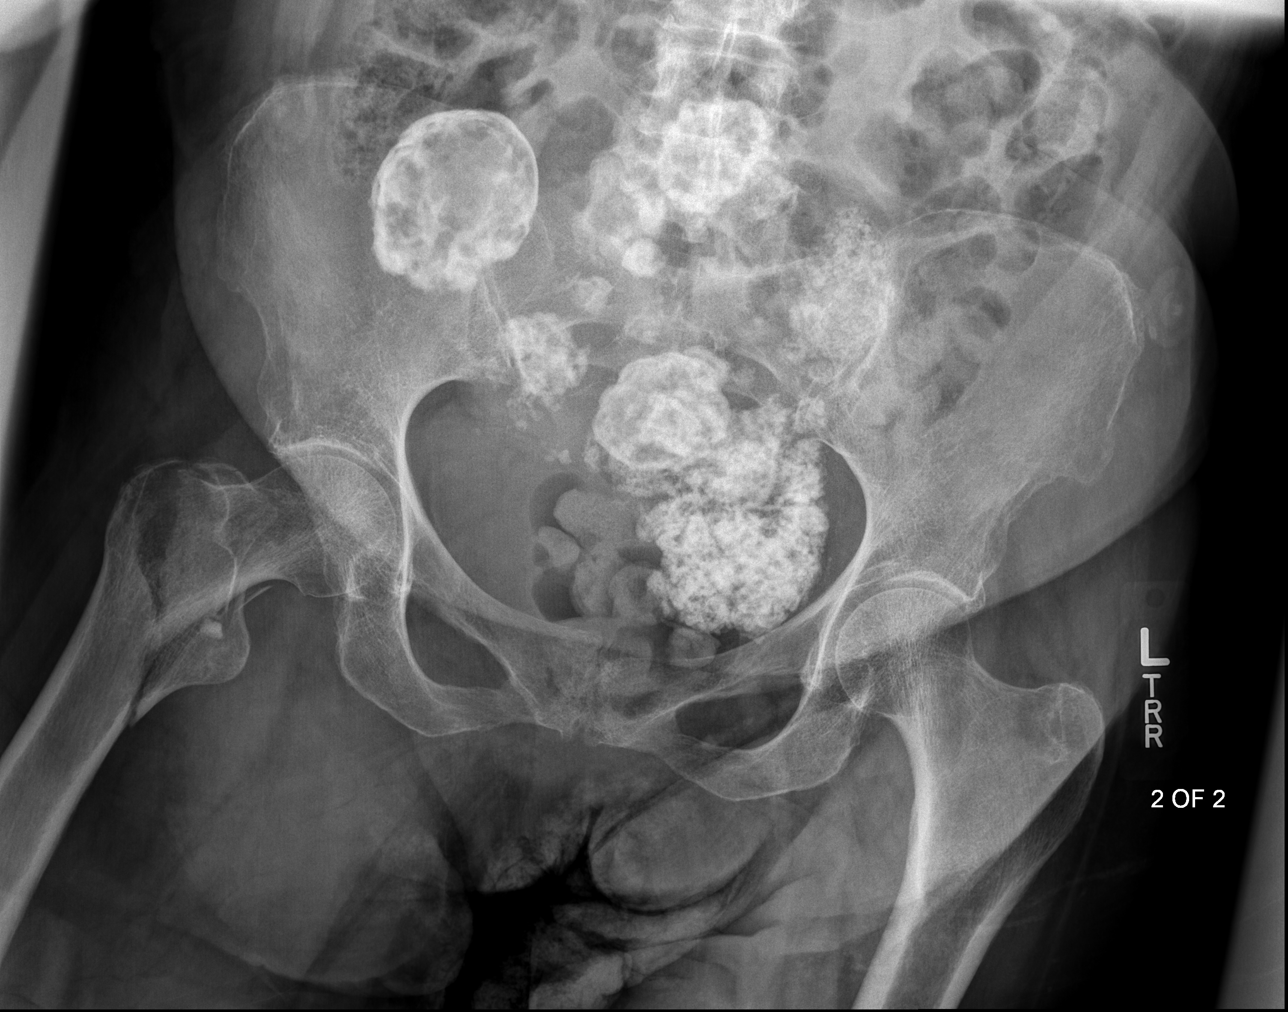
[im 3/4]
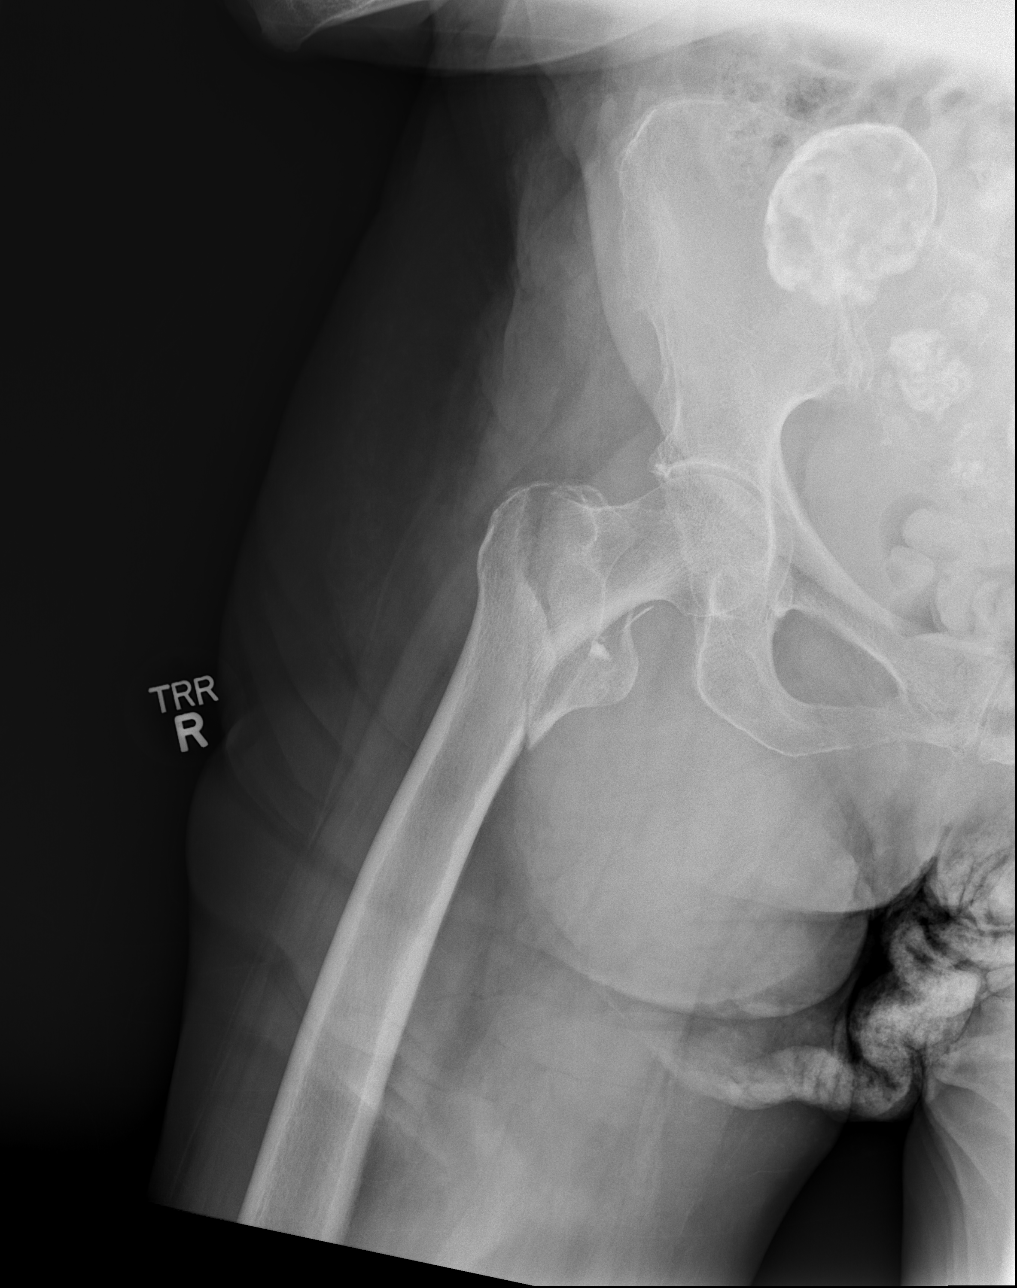
[im 4/4]
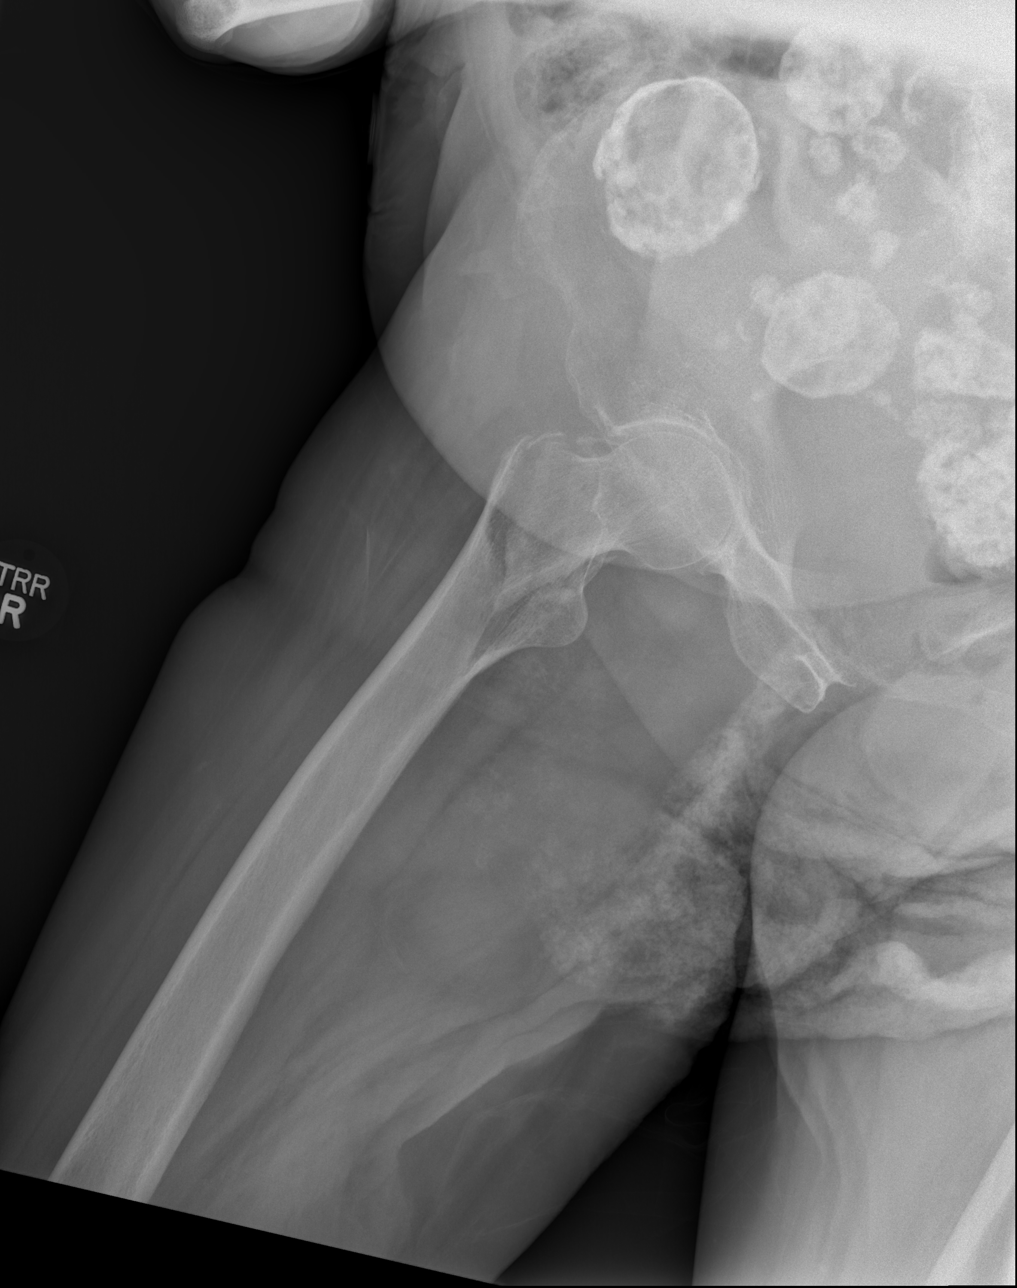

[4 of 4 positions shown; findings below may reference images not displayed]

FINDINGS: There is a comminuted proximal right femoral fracture involving the
intratrochanteric region. Mild impaction and angulation is noted at
the fracture site. Pelvic ring is intact. Multiple calcified uterine
fibroids are seen.
IMPRESSION: Comminuted intratrochanteric right femoral fracture.

## 2017-08-13 IMAGING — CR DG KNEE COMPLETE 4+V*R*
1 series · 4 of 4 positions shown · non-contrast
Comparison: None.

CLINICAL DATA: Recent fall from wheelchair with known hip fracture

EXAM:
RIGHT KNEE - COMPLETE 4+ VIEW

[Series 1: t knee obl right · 0.14mm/px · 4 of 4 slices shown]
[im 1/4]
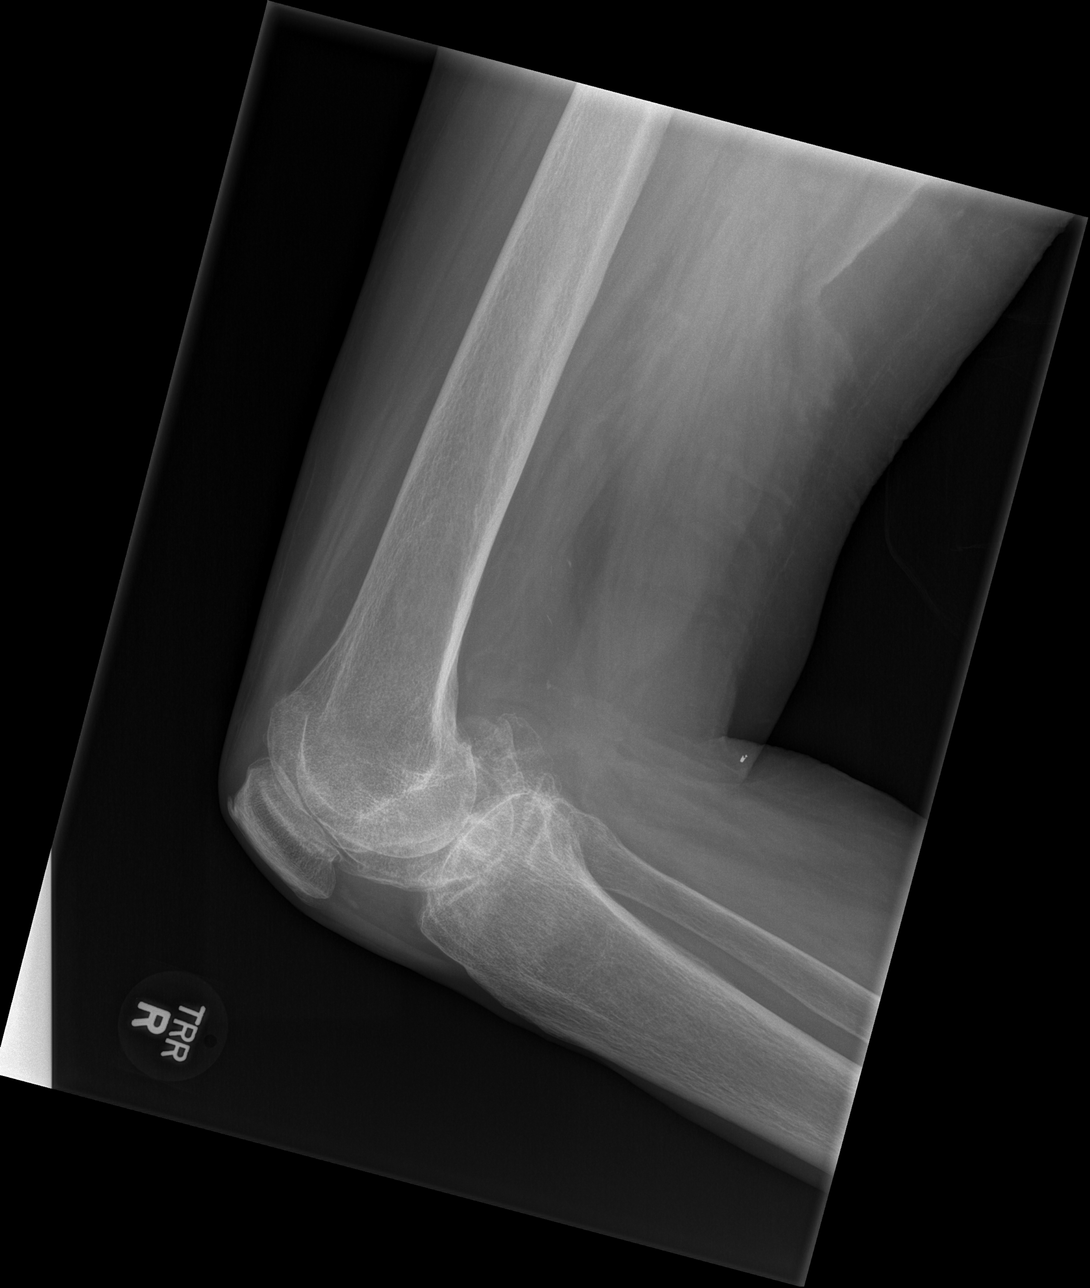
[im 2/4]
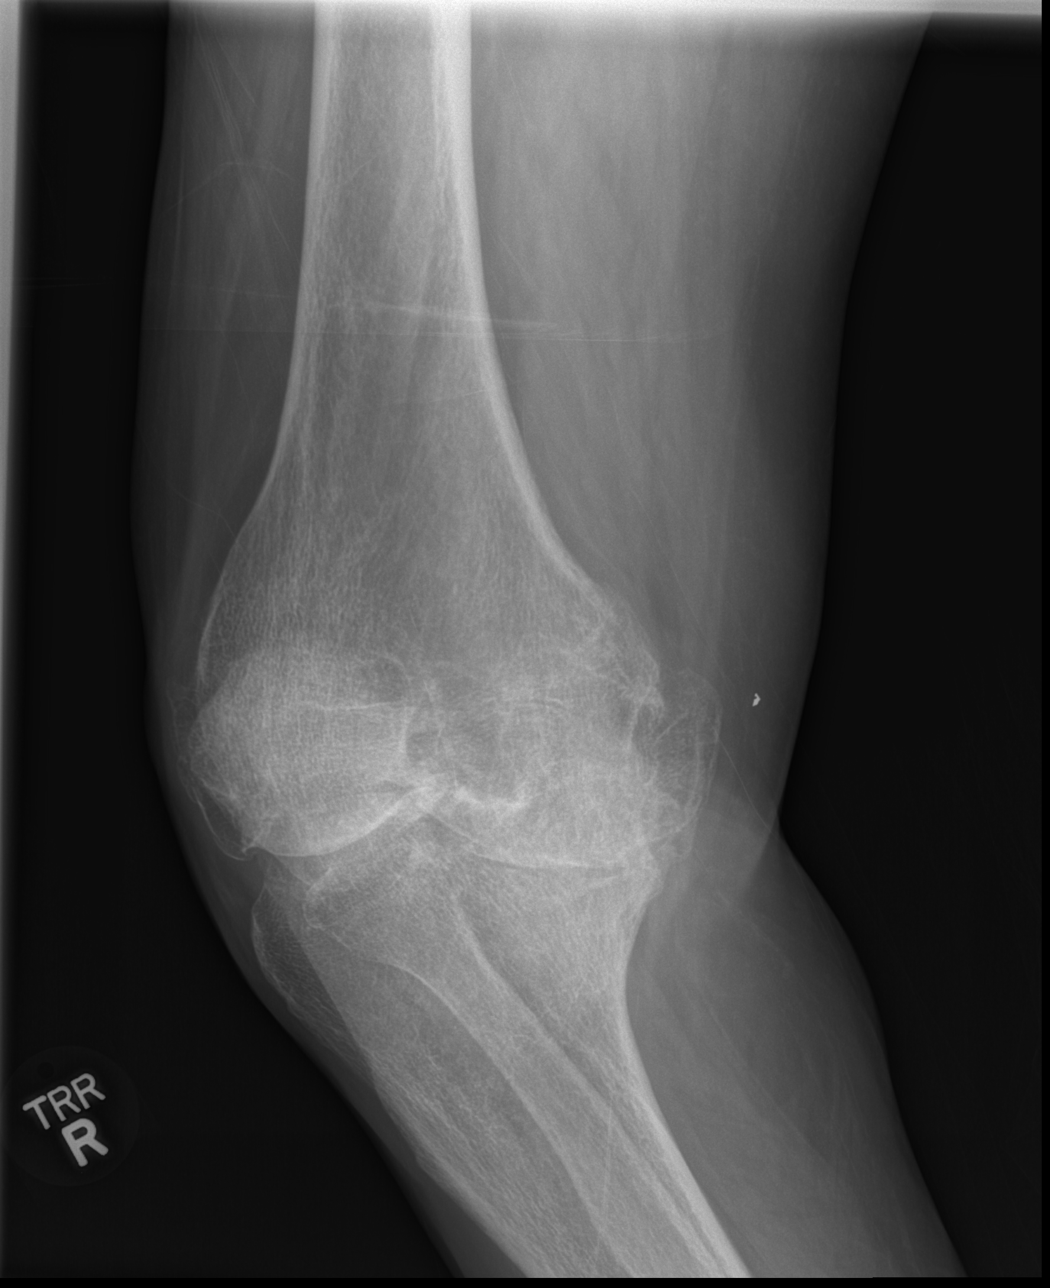
[im 3/4]
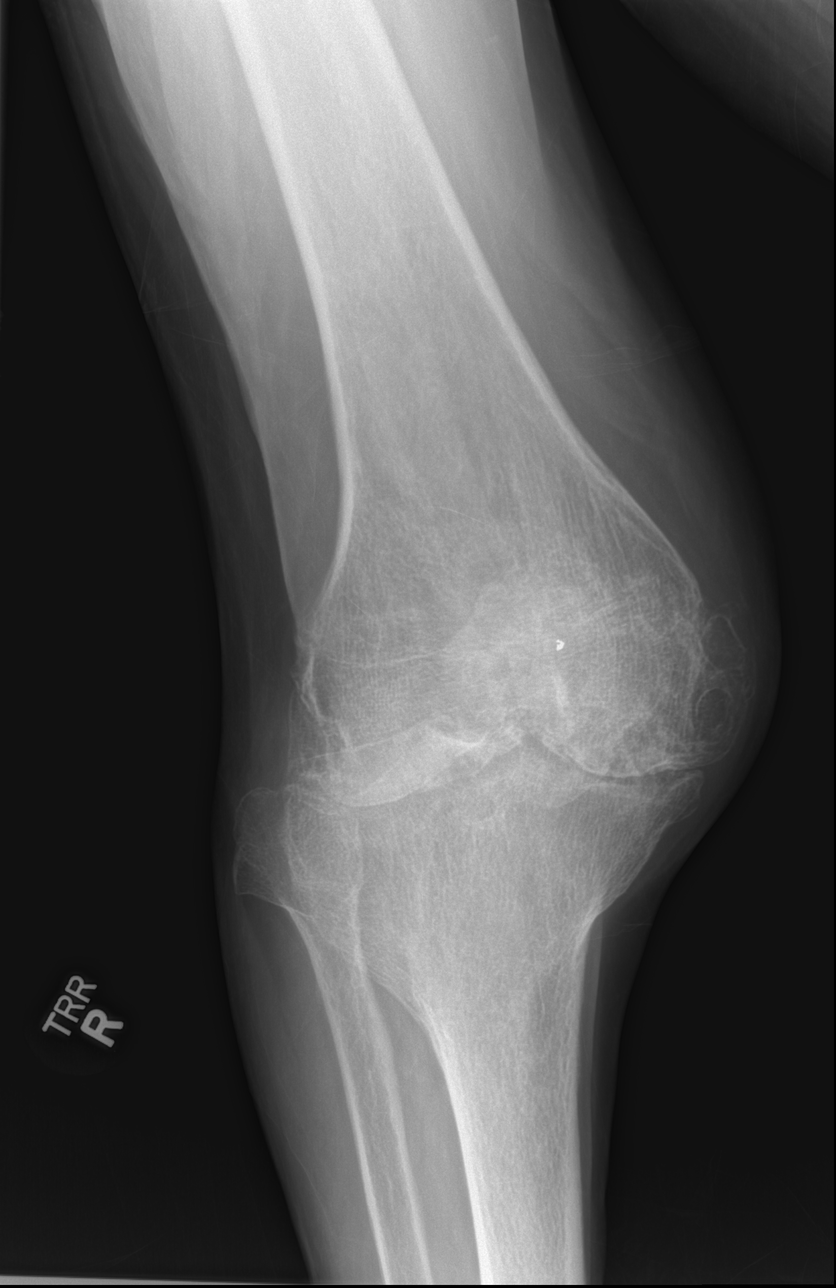
[im 4/4]
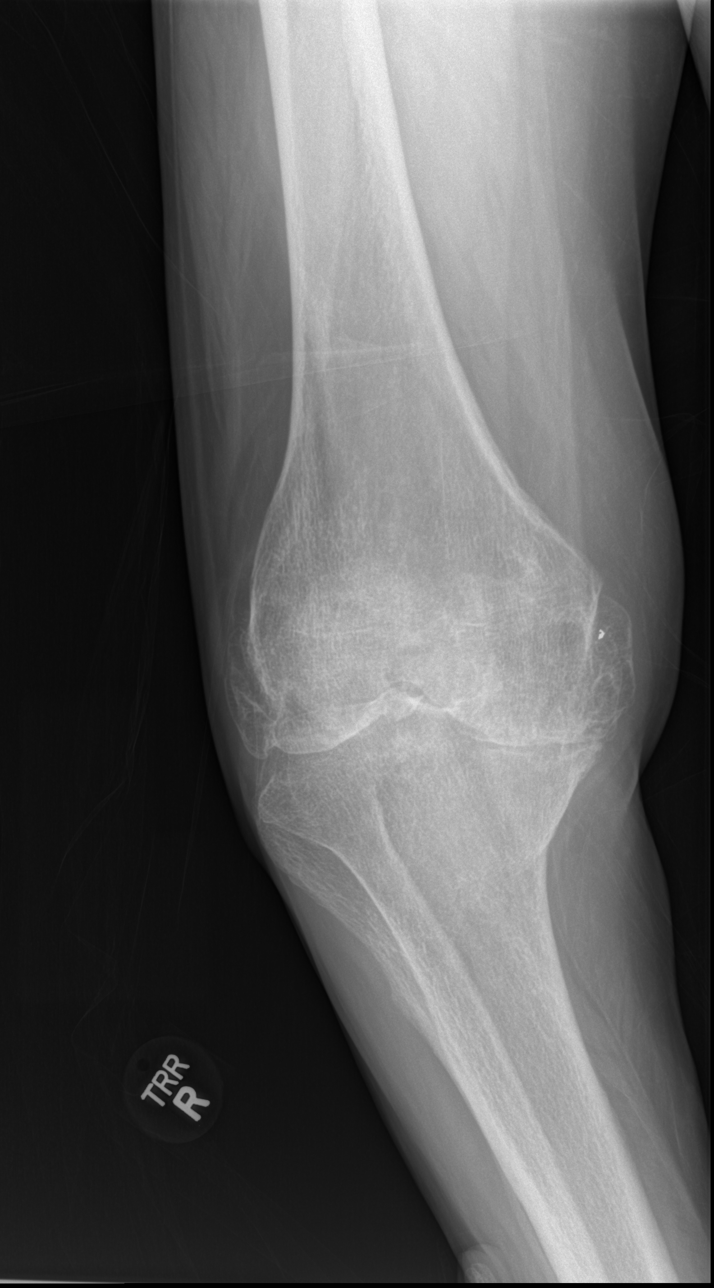

[4 of 4 positions shown; findings below may reference images not displayed]

FINDINGS: Degenerative changes of the right knee joint are noted in all 3
compartments. No joint effusion is seen. No acute fracture or
dislocation is noted.
IMPRESSION: Severe degenerative changes without acute abnormality.

## 2018-12-13 ENCOUNTER — Encounter: Payer: Self-pay | Admitting: Nurse Practitioner

## 2018-12-13 ENCOUNTER — Non-Acute Institutional Stay: Payer: Medicare Other | Admitting: Nurse Practitioner

## 2018-12-13 VITALS — BP 119/72 | HR 88 | Temp 98.8°F | Resp 22 | Ht 62.0 in | Wt 107.2 lb

## 2018-12-13 DIAGNOSIS — R1310 Dysphagia, unspecified: Secondary | ICD-10-CM

## 2018-12-13 DIAGNOSIS — R413 Other amnesia: Secondary | ICD-10-CM | POA: Insufficient documentation

## 2018-12-13 DIAGNOSIS — Z515 Encounter for palliative care: Secondary | ICD-10-CM

## 2018-12-13 NOTE — Progress Notes (Signed)
[facility] Telephone: [phone] Fax: [phone]  PATIENT NAME: Cassandra Steele DOB: 03/25/1936 MRN: [id number]  PRIMARY CARE PROVIDER:   Lyndon Steele, Cassandra M, MD  REFERRING PROVIDER: Dr [facility] RESPONSIBLE PARTY:  Cassandra StanleySheila Steele [phone] niece  Recommendations and Plan:  1. Palliative care encounter Z51.5; Palliative medicine team will continue to support patient, patient's family, and medical team. Visit consisted of counseling and education dealing with the complex and emotionally intense issues of symptom management and palliative care in the setting of serious and potentially life-threatening illness  2. Dysphagia R13.10; secondary to dementia. Continue to monitor weights, appetite, aspiration precautions.   3. Memory loss R41.3 secondary to dementia. Continue with supportive measures as chronic disease remains Progressive.  ASSESSMENT:     I visit and observed Cassandra Steele. She did not make eye contact with verbal cues so she did mon intermittently. She was cooperative with assessment. No meaningful discussion due to severe cognitive impairment. She does appear to be stable at present time in the setting of chronic disease progressive dementia. Will continue to monitor and follow, medical goes with focus on comfort and DNR remains in place. I have attempted to call Cassandra HatchetSheila, her niece, message left for update on palliative care visit. No new changes to current goals or plan of care. I have updated nursing staff.  PC 4 / 8 / 2019 PC 11 / 22 / 2016 to 11 / 22 / 2016 PC 4 / 21 / 2017 to 8 / 15 / 2017 Hospice 11 / 30 / 2016 to 4 / 10 / 2017 Hospice 8 / 8 / 15 / 2017 to 12/07/2016  BMI 19.6 9 / 4 / 2019 weight 105.2 lbs  11 / 6 / 2018 weight 108.5 lbs 1 / 2 / 2020 weight 107.2 lbs  I spent 45 minutes providing this consultation,  From 10:00 to 10:45am. More than 50% of the time in this consultation was spent coordinating communication.   HISTORY OF  PRESENT ILLNESS:  Cassandra Steele is a 83 y.o. year old female with multiple medical problems including Alzheimer's dementia, delirium, hypertension, hyperlipidemia, hypothyroidism, iron deficiency anemia, history of UTI, history ileus, anxiety, depression. Continues to reside at skilled [facility], functionally quadriplegic requires assistance with adl's, turning, positioning and prompting with pillows. She is incontinent bowel and bladder. She does require to be fed. Appetite remains fair was stable weight. No recent wounds, infections, hospitalizations, Falls. Care plan meeting held 1 / 4021 / 2020 with niece Cassandra HatchetSheila per phone and no changes in plan of care. She is followed by Podiatry at the facility. Last primary provider visit 12 / 23 / 2019 with no noted changes. At present Cassandra. Cassandra Steele is lying in bed she appears to be located, confused, chronically ill. No visitors present. . Palliative Care was asked to help address goals of care.   CODE STATUS: DNR  PPS: 30% HOSPICE ELIGIBILITY/DIAGNOSIS: TBD  PAST MEDICAL HISTORY:  Past Medical History:  Diagnosis Date  . Alzheimer's disease (HCC)   . Anemia   . Dementia (HCC)   . Depression   . Hypertension   . Mixed incontinence   . Osteoarthritis   . Thyroid disease     SOCIAL HX:  Social History   Tobacco Use  . Smoking status: Never Smoker  Substance Use Topics  . Alcohol use: No    Alcohol/week: 0.0 standard drinks    ALLERGIES: No Known Allergies   PERTINENT MEDICATIONS:  Outpatient Encounter  Medications as of 12/13/2018  Medication Sig  . acetaminophen (TYLENOL) 325 MG tablet Take 2 tablets (650 mg total) by mouth every 6 (six) hours as needed for mild pain (or Fever >/= 101).  Marland Kitchen aspirin 81 MG chewable tablet Chew 81 mg by mouth daily.  . bisacodyl (DULCOLAX) 10 MG suppository Place 10 mg rectally as needed for moderate constipation.  . Calcium Carbonate-Vitamin D 600-400 MG-UNIT per tablet Take 1 tablet by mouth 2  (two) times daily.  . ciprofloxacin (CIPRO) 500 MG tablet Take 1 tablet (500 mg total) by mouth 2 (two) times daily.  . diclofenac sodium (VOLTAREN) 1 % GEL Apply 2 g topically 2 (two) times daily as needed (for pain). Pt applies to hips and knees.  Marland Kitchen enoxaparin (LOVENOX) 40 MG/0.4ML injection Inject 0.4 mLs (40 mg total) into the skin daily.  . ferrous sulfate 325 (65 FE) MG tablet Take 1 tablet (325 mg total) by mouth 3 (three) times daily after meals.  Marland Kitchen guaiFENesin-dextromethorphan (ROBITUSSIN DM) 100-10 MG/5ML syrup Take 10 mLs by mouth 3 (three) times daily as needed for cough (and/or congestion).  . haloperidol (HALDOL) 0.5 MG tablet Take 1 tablet (0.5 mg total) by mouth daily. Pt is also able to take every six hours as needed for agitation.  Marland Kitchen HYDROcodone-acetaminophen (NORCO/VICODIN) 5-325 MG tablet Take 1-2 tablets by mouth every 6 (six) hours as needed for moderate pain.  . hydrocortisone 2.5 % cream Apply 1 application topically 2 (two) times daily as needed (for rash).   . hydrOXYzine (ATARAX/VISTARIL) 25 MG tablet Take 25 mg by mouth every 6 (six) hours as needed for itching (and/or agitation).   Marland Kitchen levothyroxine (SYNTHROID, LEVOTHROID) 75 MCG tablet Take 75 mcg by mouth daily before breakfast.   . LORazepam (ATIVAN) 0.5 MG tablet Take 1 tablet (0.5 mg total) by mouth every 8 (eight) hours as needed (for agitation.).  Marland Kitchen Melatonin 5 MG TABS Take 10 mg by mouth at bedtime.  . polyethylene glycol (MIRALAX / GLYCOLAX) packet Take 17 g by mouth daily as needed for moderate constipation.  . pravastatin (PRAVACHOL) 10 MG tablet Take 10 mg by mouth at bedtime.  . senna (SENOKOT) 8.6 MG tablet Take 2 tablets by mouth 2 (two) times daily.  . sertraline (ZOLOFT) 50 MG tablet Take 50 mg by mouth daily.   No facility-administered encounter medications on file as of 12/13/2018.     PHYSICAL EXAM:   General: NAD, frail appearing, thin, debilitated, cognitively impaired female Cardiovascular:  regular rate and rhythm Pulmonary: clear ant fields Abdomen: soft, nontender, + bowel sounds GU: no suprapubic tenderness Extremities: no edema, no joint deformities/+muscle wasting Skin: no rashes Neurological: Weakness but otherwise nonfocal/functional quadriplegic  Kikue Gerhart Prince Rome, NP

## 2018-12-26 ENCOUNTER — Telehealth: Payer: Self-pay | Admitting: Nurse Practitioner

## 2018-12-26 NOTE — Telephone Encounter (Signed)
[name], Ms. Otwell's niece return my call. We talked about purpose for palliative care visit. Consent obtained. We talked about symptoms, appetite and weight loss. We talked about pending lab work and if her total protein and albumin is trending down with weight loss may be able to revisit Hospice Services. She previously was under [facility] but discharge due to stability. We talked about progression of chronic disease in the setting of dementia. We talked about her functional level. Family had requested that Ms. Nourse to be moved to the door rather than at the window. Her roommate likes the room cooler and Ms. Laslie is often cold. We talked about progression towards the end part of her life. We talked about her cognitive and functional impairment. We talked about role of palliative care and plan of care. Medical Goals of care continue to focus on Comfort. DNR remains in place.  Total time spent 30 minutes  Documentation 10 minutes  Phone discussion 20 minutes

## 2019-01-27 ENCOUNTER — Encounter: Payer: Self-pay | Admitting: Nurse Practitioner

## 2019-01-27 ENCOUNTER — Non-Acute Institutional Stay: Payer: Medicare Other | Admitting: Nurse Practitioner

## 2019-01-27 VITALS — BP 118/64 | HR 74 | Temp 98.2°F | Resp 18 | Ht 62.0 in | Wt 111.0 lb

## 2019-01-27 DIAGNOSIS — R413 Other amnesia: Secondary | ICD-10-CM

## 2019-01-27 DIAGNOSIS — R1310 Dysphagia, unspecified: Secondary | ICD-10-CM

## 2019-01-27 DIAGNOSIS — Z515 Encounter for palliative care: Secondary | ICD-10-CM

## 2019-01-27 NOTE — Progress Notes (Signed)
Therapist, nutritional Palliative Care Consult Note Telephone: [phone]  Fax: [phone]  PATIENT NAME: Cassandra Steele DOB: 1935/12/17 MRN: [id number]  PRIMARY CARE PROVIDER:   Lyndon Code, MD  REFERRING PROVIDER:  Dr Smith/[facility]   RESPONSIBLE PARTY: [name] [phone] niece    RECOMMENDATIONS and PLAN:  1. Palliative care encounter Z51.5; Palliative medicine team will continue to support patient, patient's family, and medical team. Visit consisted of counseling and education dealing with the complex and emotionally intense issues of symptom management and palliative care in the setting of serious and potentially life-threatening illness  2. Dysphagia R13.10; secondary to dementia. Continue to monitor weights, appetite, aspiration precautions.   3. Memory loss R41.3 secondary to dementia. Continue with supportive measures as chronic disease remains Progressive.  ASSESSMENT:    I visited and observed Cassandra Steele. No meaningful discussion due to severe cognitive impairment. She was a little irritable with assessment but overall cooperative. She did not open her eyes to verbal cues. She is actually gained weight since the last palliative care visit. Overall she continues to decline in the city of chronic disease of dementia but presently will continue under palliative care as she has now gained weight. Will continue to Monitor and follow with next visit in 4 weeks or sooner should she declined. After the nursing staff. I attempted to contact her niece, message left  BMI 20.3 1 / 2 / 2020 weight 107.2 lbs 2 / 4 / 2020 weight 106.3 lbs 3 / 11 / 2020 weight 111.0 lbs  2 / 14 / 2,020 WBC 7.6, hemoglobin 12.3, hemocrit 35.5, platelets 266, sodium 139, potassium 4.7, chloride 102, Co2 24, calcium 9.9, bun 11.7, creatinine 0.60, glucose 97, albumin 3.8, total protein 7.9  PC 4 / 8 / 2019 PC 11 / 22 / 2016 to 11 / 22 / 2016 PC 4 / 21 / 2017  to 8 / 15 / 2017 Hospice 11 / 30 / 2016 to 4 / 10 / 2017 Hospice 8 / 8 / 15 / 2017 to 12/07/2016  I spent 45 minutes providing this consultation,  from 11:30am to 12:15pm. More than 50% of the time in this consultation was spent coordinating communication.   HISTORY OF PRESENT ILLNESS:  Cassandra Steele is a 83 y.o. year old female with multiple medical problems including Alzheimer's dementia, delirium, hypertension, hyperlipidemia, hypothyroidism, iron deficiency anemia, history of UTI, history ileus, anxiety, depression. She continue to reside in skilled [facility] Facility. She does remain functionally quadriplegic, bed-bound, Total Care, incontinent bowel and bladder. She does require to be fed and appetite has remained fair to poor. She has actually had a weight gain. She remain severely cognitively and functionally impaired. No recent wounds, Falls, infections, hospitalizations. Medical goals are to focus on comfort with DNR remaining in place. At present she is lying in bed. She appears debilitated, comfortable. New visitors present. Palliative Care was asked to help address goals of care.   CODE STATUS: DNR  PPS: 30% HOSPICE ELIGIBILITY/DIAGNOSIS: TBD  PAST MEDICAL HISTORY:  Past Medical History:  Diagnosis Date  . Alzheimer's disease (HCC)   . Anemia   . Dementia (HCC)   . Depression   . Hypertension   . Mixed incontinence   . Osteoarthritis   . Thyroid disease     SOCIAL HX:  Social History   Tobacco Use  . Smoking status: Never Smoker  Substance Use Topics  . Alcohol use: No  Alcohol/week: 0.0 standard drinks    ALLERGIES: No Known Allergies   PERTINENT MEDICATIONS:  Outpatient Encounter Medications as of 01/27/2019  Medication Sig  . acetaminophen (TYLENOL) 325 MG tablet Take 2 tablets (650 mg total) by mouth every 6 (six) hours as needed for mild pain (or Fever >/= 101).  Marland Kitchen aspirin 81 MG chewable tablet Chew 81 mg by mouth daily.  . bisacodyl (DULCOLAX)  10 MG suppository Place 10 mg rectally as needed for moderate constipation.  . Calcium Carbonate-Vitamin D 600-400 MG-UNIT per tablet Take 1 tablet by mouth 2 (two) times daily.  . ciprofloxacin (CIPRO) 500 MG tablet Take 1 tablet (500 mg total) by mouth 2 (two) times daily.  . diclofenac sodium (VOLTAREN) 1 % GEL Apply 2 g topically 2 (two) times daily as needed (for pain). Pt applies to hips and knees.  Marland Kitchen enoxaparin (LOVENOX) 40 MG/0.4ML injection Inject 0.4 mLs (40 mg total) into the skin daily.  . ferrous sulfate 325 (65 FE) MG tablet Take 1 tablet (325 mg total) by mouth 3 (three) times daily after meals.  Marland Kitchen guaiFENesin-dextromethorphan (ROBITUSSIN DM) 100-10 MG/5ML syrup Take 10 mLs by mouth 3 (three) times daily as needed for cough (and/or congestion).  . haloperidol (HALDOL) 0.5 MG tablet Take 1 tablet (0.5 mg total) by mouth daily. Pt is also able to take every six hours as needed for agitation.  Marland Kitchen HYDROcodone-acetaminophen (NORCO/VICODIN) 5-325 MG tablet Take 1-2 tablets by mouth every 6 (six) hours as needed for moderate pain.  . hydrocortisone 2.5 % cream Apply 1 application topically 2 (two) times daily as needed (for rash).   . hydrOXYzine (ATARAX/VISTARIL) 25 MG tablet Take 25 mg by mouth every 6 (six) hours as needed for itching (and/or agitation).   Marland Kitchen levothyroxine (SYNTHROID, LEVOTHROID) 75 MCG tablet Take 75 mcg by mouth daily before breakfast.   . LORazepam (ATIVAN) 0.5 MG tablet Take 1 tablet (0.5 mg total) by mouth every 8 (eight) hours as needed (for agitation.).  Marland Kitchen Melatonin 5 MG TABS Take 10 mg by mouth at bedtime.  . polyethylene glycol (MIRALAX / GLYCOLAX) packet Take 17 g by mouth daily as needed for moderate constipation.  . pravastatin (PRAVACHOL) 10 MG tablet Take 10 mg by mouth at bedtime.  . senna (SENOKOT) 8.6 MG tablet Take 2 tablets by mouth 2 (two) times daily.  . sertraline (ZOLOFT) 50 MG tablet Take 50 mg by mouth daily.   No facility-administered encounter  medications on file as of 01/27/2019.     PHYSICAL EXAM:   General: thin, fragile, chronically ill, confused debilitated female Cardiovascular: regular rate and rhythm Pulmonary: clear ant fields Abdomen: soft, nontender, + bowel sounds GU: no suprapubic tenderness Extremities: no edema, no joint deformities/muscle wasting Skin: no rashes Neurological: Weakness but otherwise nonfocal/functional quadriplegic  Maan Zarcone Prince Rome, NP

## 2019-01-28 ENCOUNTER — Other Ambulatory Visit: Payer: Self-pay

## 2019-03-12 ENCOUNTER — Encounter: Payer: Self-pay | Admitting: Nurse Practitioner

## 2019-03-12 ENCOUNTER — Non-Acute Institutional Stay: Payer: Medicare Other | Admitting: Nurse Practitioner

## 2019-03-12 VITALS — BP 124/70 | HR 60 | Temp 98.1°F | Resp 18 | Wt 108.8 lb

## 2019-03-12 DIAGNOSIS — R1310 Dysphagia, unspecified: Secondary | ICD-10-CM

## 2019-03-12 DIAGNOSIS — R630 Anorexia: Secondary | ICD-10-CM | POA: Insufficient documentation

## 2019-03-12 DIAGNOSIS — Z515 Encounter for palliative care: Secondary | ICD-10-CM

## 2019-03-12 NOTE — Progress Notes (Signed)
Therapist, nutritional Palliative Care Consult Note Telephone: [phone]  Fax: [phone]  PATIENT NAME: Cassandra Steele DOB: [birth year] MRN: [id number]  PRIMARY CARE PROVIDER:  Dr Jamas Lav PROVIDER:  Dr [facility] RESPONSIBLE PARTY:   [name] [phone] niece    RECOMMENDATIONS and PLAN:   1.Palliative care encounter Z51.5; Palliative medicine team will continue to support patient, patient's family, and medical team. Visit consisted of counseling and education dealing with the complex and emotionally intense issues of symptom management and palliative care in the setting of serious and potentially life-threatening illness  2.Dysphagia R13.10; secondary todementia. Continue to monitor weights, appetite, aspiration precautions.  3. Anorexia R63.0 but appetite remaining declined. Continue to encourage supplements and comfort feedings  ASSESSMENT:  PC 4 / 8 / 2019 PC 11 / 22 / 2016 to 11 / 22 / 2016 PC 4 / 21 / 2017 to 8 / 15 / 2017 Hospice 11 / 30 / 2016 to 4 / 10 / 2017 Hospice 8 / 8 / 15 / 2017 to 12/07/2016  I visited and observe Ms Heart. She did not open her eyes or respond to verbal cues. She was moving around in bed. She was cooperative with assessment. She mumbled words as word salad. No meaningful discussion due to severe cognitive impairment. She has lost 3 lbs in 8 weeks. Will continue to Monitor and follow weight loss with next visit in 4 weeks. May revisit Hospice Services if she continues to decline, develop wounds, infection. I have attempted to contact her niece, [name] [phone] message left. I updated nursing staff know any changes to current goals or plan of care as she does remain a DNR with focus on comfort.  BMI 19.9 1 / 2 / 2020 weight 107.2 lbs 3 / 11 / 2020 weight 111.0 lbs 8 / 20 / 2020 weight 108.8 lbs  2 / 14 / 2020 wbc 7.6, hemoglobin 12.3, hemocrit 35.5, platelets 266, sodium 139,  potassium 4.7, chloride 102, Co2 24, calcium 9.9, bun 11.7, 0.60, glucose 97, total protein 7.9, albumin 3.8  I spent 40 minutes providing this consultation,  from 10:45 to 11:10am. More than 50% of the time in this consultation was spent coordinating communication.   HISTORY OF PRESENT ILLNESS:  [name] is a 83 y.o. year old female with multiple medical problems including Alzheimer's dementia, delirium, hypertension, hyperlipidemia, hypothyroidism, iron deficiency anemia, history of UTI, history ileus, anxiety, depression. 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 She does remain bed-bound, total ADL dependent functionally quadriplegic incontinent bowel and bladder. She does required to be fed. Appetite has remained poor. Care plan meeting was held 4/ 16 / 2020 without any new changes to goals are plan of care. She is followed by Podiatry at the facility. No recent wounds, Falls, hospitalizations come infections. Medical goals to continue to focus on comfort. She is severely cognitively impaired and then able to verbalize her needs. Staff endorses she does have times where she is more agitated than others. At present she is lying in bed. She appears debilitated, confused. Palliative Care was asked to help to continue to address goals of care.   CODE STATUS: DNR  PPS: 30% HOSPICE ELIGIBILITY/DIAGNOSIS: TBD  PAST MEDICAL HISTORY:  Past Medical History:  Diagnosis Date  . Alzheimer's disease (HCC)   . Anemia   . Dementia (HCC)   . Depression   . Hypertension   . Mixed  incontinence   . Osteoarthritis   . Thyroid disease     SOCIAL HX:  Social History   Tobacco Use  . Smoking status: Never Smoker  Substance Use Topics  . Alcohol use: No    Alcohol/week: 0.0 standard drinks    ALLERGIES: No Known Allergies   PERTINENT MEDICATIONS:  Outpatient Encounter Medications as of 03/12/2019  Medication Sig  . acetaminophen (TYLENOL)  325 MG tablet Take 2 tablets (650 mg total) by mouth every 6 (six) hours as needed for mild pain (or Fever >/= 101).  Marland Kitchen. aspirin 81 MG chewable tablet Chew 81 mg by mouth daily.  . bisacodyl (DULCOLAX) 10 MG suppository Place 10 mg rectally as needed for moderate constipation.  . Calcium Carbonate-Vitamin D 600-400 MG-UNIT per tablet Take 1 tablet by mouth 2 (two) times daily.  . ciprofloxacin (CIPRO) 500 MG tablet Take 1 tablet (500 mg total) by mouth 2 (two) times daily.  . diclofenac sodium (VOLTAREN) 1 % GEL Apply 2 g topically 2 (two) times daily as needed (for pain). Pt applies to hips and knees.  Marland Kitchen. enoxaparin (LOVENOX) 40 MG/0.4ML injection Inject 0.4 mLs (40 mg total) into the skin daily.  . ferrous sulfate 325 (65 FE) MG tablet Take 1 tablet (325 mg total) by mouth 3 (three) times daily after meals.  Marland Kitchen. guaiFENesin-dextromethorphan (ROBITUSSIN DM) 100-10 MG/5ML syrup Take 10 mLs by mouth 3 (three) times daily as needed for cough (and/or congestion).  . haloperidol (HALDOL) 0.5 MG tablet Take 1 tablet (0.5 mg total) by mouth daily. Pt is also able to take every six hours as needed for agitation.  Marland Kitchen. HYDROcodone-acetaminophen (NORCO/VICODIN) 5-325 MG tablet Take 1-2 tablets by mouth every 6 (six) hours as needed for moderate pain.  . hydrocortisone 2.5 % cream Apply 1 application topically 2 (two) times daily as needed (for rash).   . hydrOXYzine (ATARAX/VISTARIL) 25 MG tablet Take 25 mg by mouth every 6 (six) hours as needed for itching (and/or agitation).   Marland Kitchen. levothyroxine (SYNTHROID, LEVOTHROID) 75 MCG tablet Take 75 mcg by mouth daily before breakfast.   . LORazepam (ATIVAN) 0.5 MG tablet Take 1 tablet (0.5 mg total) by mouth every 8 (eight) hours as needed (for agitation.).  Marland Kitchen. Melatonin 5 MG TABS Take 10 mg by mouth at bedtime.  . polyethylene glycol (MIRALAX / GLYCOLAX) packet Take 17 g by mouth daily as needed for moderate constipation.  . pravastatin (PRAVACHOL) 10 MG tablet Take 10 mg  by mouth at bedtime.  . senna (SENOKOT) 8.6 MG tablet Take 2 tablets by mouth 2 (two) times daily.  . sertraline (ZOLOFT) 50 MG tablet Take 50 mg by mouth daily.   No facility-administered encounter medications on file as of 03/12/2019.     PHYSICAL EXAM:   General: NAD, frail appearing, thin, chronically ill demented female Cardiovascular: regular rate and rhythm Pulmonary: clear ant fields Abdomen: soft, nontender, + bowel sounds GU: no suprapubic tenderness Extremities: no edema, no joint deformities/muscle wasting, contracted Skin: no rashes Neurological: Weakness but otherwise nonfocal/functional quadriplegic   Prince RomeZ , NP

## 2019-03-13 ENCOUNTER — Other Ambulatory Visit: Payer: Self-pay

## 2019-06-06 ENCOUNTER — Other Ambulatory Visit: Payer: Self-pay

## 2019-06-06 ENCOUNTER — Encounter: Payer: Self-pay | Admitting: Nurse Practitioner

## 2019-06-06 ENCOUNTER — Non-Acute Institutional Stay: Payer: Medicare Other | Admitting: Nurse Practitioner

## 2019-06-06 VITALS — BP 140/70 | HR 80 | Temp 98.3°F | Resp 18 | Wt 115.8 lb

## 2019-06-06 DIAGNOSIS — Z515 Encounter for palliative care: Secondary | ICD-10-CM

## 2019-06-06 DIAGNOSIS — R1310 Dysphagia, unspecified: Secondary | ICD-10-CM

## 2019-06-06 DIAGNOSIS — R630 Anorexia: Secondary | ICD-10-CM

## 2019-06-06 NOTE — Progress Notes (Signed)
[place] Consult Note Telephone: [phone]  Fax: [phone]  PATIENT NAME: Cassandra Steele DOB: 1935/11/28 MRN: [id number]  PRIMARY CARE PROVIDER:   Dr Yves Dill PROVIDER:  Dr Hodges/[facility] RESPONSIBLE PARTY:  Sharion Balloon [phone] niece  Recommendations and Plan:  1. Palliative care encounter Z51.5; Palliative medicine team will continue to support patient, patient's family, and medical team. Visit consisted of counseling and education dealing with the complex and emotionally intense issues of symptom management and palliative care in the setting of serious and potentially life-threatening illness  2. Dysphagia R13.10; secondary to dementia. Continue to monitor weights, appetite, aspiration precautions.   3. Anorexia R63.0 but appetite remaining declined but weight gain. Continue to encourage supplements and comfort feedings.   ASSESSMENT:     I visited and observed Cassandra Steele. She had her eyes closed but mumbling incomprehensible words. No meaningful discussion as severely cognitively impaired. She was cooperative for assessment though little irritable at times. No meaningful discussion due to cognitive impairment. She does appear to be slowly progressing in the study of chronic disease, custodial care. Emotional support provided. Medical goals continue to focus on comfort with DNR in place. I have attempted to contact her niece for update on palliative care visit. I have updated nursing staff.  PC 4 / 8 / 2019 PC 11 / 22 / 2016 to 11 / 22 / 2016 PC 4 / 21 / 2017 to 8 / 15 / 2017 Hospice 11 / 30 / 2016 to 4 / 10 / 2017 Hospice 8 / 8 / 15 / 2017 to 12/07/2016  BMI 19.6 9 / 4 / 2019 weight 105.2 lbs  11 / 6 / 2018 weight 108.5 lbs 1 / 2 / 2020 weight 107.2 lbs 05/22/2019 weight 115.8 lbs   I spent 45 minutes providing this consultation,  from 10:00am to 10:45am. More than 50% of the time in this  consultation was spent coordinating communication.   HISTORY OF PRESENT ILLNESS:  Cassandra Steele is a 83 y.o. year old female with multiple medical problems includingAlzheimer's dementia, delirium, hypertension, hyperlipidemia, hypothyroidism, iron deficiency anemia, history of UTI, history ileus, anxiety, depression.  Cassandra Sukhu continues to reside at [place] [facility].  She is severely functionally and cognitively impaired. She is totaled bed dependent, ADL, incontinent bowel and bladder, custodial care. She does require to be fed and appetite varies but typically poor. Regular diet pureed texture with mildly thickened consistency. No recent infection, hospitalization, Falls. Medical goals are to continue to focus on comfort with DNR in place. At present she is lying in bed in a fetal position. She appears debilitated but comfortable. No visitors present. Palliative Care was asked to help to continue to address goals of care.   CODE STATUS: DNR  PPS: 30% HOSPICE ELIGIBILITY/DIAGNOSIS: TBD  PAST MEDICAL HISTORY:  Past Medical History:  Diagnosis Date  . Alzheimer's disease ([place])   . Anemia   . Dementia ([place])   . Depression   . Hypertension   . Mixed incontinence   . Osteoarthritis   . Thyroid disease     SOCIAL HX:  Social History   Tobacco Use  . Smoking status: Never Smoker  Substance Use Topics  . Alcohol use: No    Alcohol/week: 0.0 standard drinks    ALLERGIES: No Known Allergies   PERTINENT MEDICATIONS:  Outpatient Encounter Medications as of 06/06/2019  Medication Sig  . levothyroxine (SYNTHROID, LEVOTHROID)  75 MCG tablet Take 75 mcg by mouth daily before breakfast.   . acetaminophen (TYLENOL) 325 MG tablet Take 2 tablets (650 mg total) by mouth every 6 (six) hours as needed for mild pain (or Fever >/= 101). (Patient not taking: Reported on 06/06/2019)  . aspirin 81 MG chewable tablet Chew 81 mg by mouth daily.  . bisacodyl  (DULCOLAX) 10 MG suppository Place 10 mg rectally as needed for moderate constipation.  . Calcium Carbonate-Vitamin D 600-400 MG-UNIT per tablet Take 1 tablet by mouth 2 (two) times daily.  . ciprofloxacin (CIPRO) 500 MG tablet Take 1 tablet (500 mg total) by mouth 2 (two) times daily. (Patient not taking: Reported on 06/06/2019)  . diclofenac sodium (VOLTAREN) 1 % GEL Apply 2 g topically 2 (two) times daily as needed (for pain). Pt applies to hips and knees.  Marland Kitchen. enoxaparin (LOVENOX) 40 MG/0.4ML injection Inject 0.4 mLs (40 mg total) into the skin daily.  . ferrous sulfate 325 (65 FE) MG tablet Take 1 tablet (325 mg total) by mouth 3 (three) times daily after meals. (Patient not taking: Reported on 06/06/2019)  . guaiFENesin-dextromethorphan (ROBITUSSIN DM) 100-10 MG/5ML syrup Take 10 mLs by mouth 3 (three) times daily as needed for cough (and/or congestion).  . haloperidol (HALDOL) 0.5 MG tablet Take 1 tablet (0.5 mg total) by mouth daily. Pt is also able to take every six hours as needed for agitation. (Patient not taking: Reported on 06/06/2019)  . HYDROcodone-acetaminophen (NORCO/VICODIN) 5-325 MG tablet Take 1-2 tablets by mouth every 6 (six) hours as needed for moderate pain. (Patient not taking: Reported on 06/06/2019)  . hydrocortisone 2.5 % cream Apply 1 application topically 2 (two) times daily as needed (for rash).   . hydrOXYzine (ATARAX/VISTARIL) 25 MG tablet Take 25 mg by mouth every 6 (six) hours as needed for itching (and/or agitation).   . LORazepam (ATIVAN) 0.5 MG tablet Take 1 tablet (0.5 mg total) by mouth every 8 (eight) hours as needed (for agitation.). (Patient not taking: Reported on 06/06/2019)  . Melatonin 5 MG TABS Take 10 mg by mouth at bedtime.  . polyethylene glycol (MIRALAX / GLYCOLAX) packet Take 17 g by mouth daily as needed for moderate constipation.  . pravastatin (PRAVACHOL) 10 MG tablet Take 10 mg by mouth at bedtime.  . senna (SENOKOT) 8.6 MG tablet Take 2 tablets by  mouth 2 (two) times daily.  . sertraline (ZOLOFT) 50 MG tablet Take 50 mg by mouth daily.   No facility-administered encounter medications on file as of 06/06/2019.     PHYSICAL EXAM:   General: debilitated, severely cognitively and functionally impaired female,  Cardiovascular: regular rate and rhythm Pulmonary: clear ant fields Abdomen: soft, nontender, + bowel sounds Extremities: no edema, no joint deformities/muscle wasting Skin: no rashes Neurological: Weakness but otherwise nonfocal/functional quadriplegic  Izeah Vossler Prince RomeZ Carlas Vandyne, NP

## 2019-06-09 ENCOUNTER — Telehealth: Payer: Self-pay | Admitting: Nurse Practitioner

## 2019-06-09 NOTE — Telephone Encounter (Signed)
I called Ms. Cassandra Steele [phone] niece for discussion of PC visit, message left to return call, contact information given

## 2019-07-11 ENCOUNTER — Other Ambulatory Visit: Payer: Self-pay

## 2019-07-11 ENCOUNTER — Encounter: Payer: Self-pay | Admitting: Nurse Practitioner

## 2019-07-11 ENCOUNTER — Non-Acute Institutional Stay: Payer: Medicare Other | Admitting: Nurse Practitioner

## 2019-07-11 VITALS — BP 124/70 | HR 80 | Temp 98.0°F | Resp 18 | Wt 115.5 lb

## 2019-07-11 DIAGNOSIS — Z515 Encounter for palliative care: Secondary | ICD-10-CM

## 2019-07-11 NOTE — Progress Notes (Signed)
[facility] Consult Note Telephone: [phone]  Fax: [phone]  PATIENT NAME: Cassandra Steele DOB: 01/10/1936 MRN: [id number]  PRIMARY CARE PROVIDER:  Dr Kathleen LimeHodges REFERRING PROVIDER: Dr Hodges/[facility] RESPONSIBLE PARTY:    Misty StanleySheila Bryant [phone] niece  RECOMMENDATIONS and PLAN:   1.ACP: DNR; medical goals of care focus on comfort  2.Dysphagia R13.10; secondary todementia. Continue to monitor weights, appetite, aspiration precautions.  3. Anorexia R63.0 but appetite remaining declined. Continue to encourage supplements and comfort feedings  4. Palliative care encounter Z51.5; Palliative medicine team will continue to support patient, patient's family, and medical team. Visit consisted of counseling and education dealing with the complex and emotionally intense issues of symptom management and palliative care in the setting of serious and potentially life-threatening illness  PC 4 / 8 / 2019 PC 11 / 22 / 2016 to 11 / 22 / 2016 PC 4 / 21 / 2017 to 8 / 15 / 2017 Hospice 11 / 30 / 2016 to 4 / 10 / 2017 Hospice 8 / 8 / 15 / 2017 to 12/07/2016  I spent 30 minutes providing this consultation,  from 10:00am to 10:30am. More than 50% of the time in this consultation was spent coordinating communication.   HISTORY OF PRESENT ILLNESS:  Cassandra LoganFrances Emery is a 83 y.o. year old female with multiple medical problems including Alzheimer's dementia, delirium, hypertension, hyperlipidemia, hypothyroidism, iron deficiency anemia, history of UTI, history ileus, anxiety, depression. Ms. Lafayette DragonCarr [facility] to reside [facility]. She is bed bound, from shy quadriplegic. She does require staff to turn and physician her with pillows. She is ADL dependent totally and incontinent bowel and bladder. She does require to be fed and appetite has been fair with weight remaining stable at  present time. 8 / 20 / 2020 care plan meeting was held but [facility] power of attorney did not respond to the pre-plan conference. No recent Falls, wounds, infections, hospitalizations. Medical goals of care focus on comfort, DNR in place. She has followed by Podiatry. 8 / 19 / 2020 covid-19 test negative. She is severely cognitively impaired and unable to verbalize her knee. At present she is lying in bed and a fetal position. She appears comfortable. No visitors present. I visited observe [name]. She did not make eye contact as she kept her eyes closed. Palliative care visit. She did make sounds that were in comprehensible. She was cooperative with assessment. Emotional support provided. She continues to appear stable at present time as she previously was under [facility] in the past their discharge due to stability. We'll continue to monitor,  follow with palliative care watching for decline and weight loss of 10% or greater. I have attempted to contact [facility] power of attorney, message left for update on palliative care visit. I updated nursing staff any changes to current goals are plan of care. Focus to remain on comfort with DNR in place. Palliative Care was asked to help to continue to address goals of care.   6 / 4 / 2020 weight 109.9 lbs 7 / 9 / 2020 weight 115.8 lbs 8 / 6 / 2020 weight 115.1 lbs BMI 21  CODE STATUS: DNR  PPS: 30% HOSPICE ELIGIBILITY/DIAGNOSIS: TBD  PAST MEDICAL HISTORY:  Past Medical History:  Diagnosis Date  . Alzheimer's disease ([facility])   . Anemia   . Dementia ([facility])   . Depression   .  Hypertension   . Mixed incontinence   . Osteoarthritis   . Thyroid disease     SOCIAL HX:  Social History   Tobacco Use  . Smoking status: Never Smoker  Substance Use Topics  . Alcohol use: No    Alcohol/week: 0.0 standard drinks    ALLERGIES: No Known Allergies   PERTINENT MEDICATIONS:  Outpatient Encounter Medications as of  07/11/2019  Medication Sig  . acetaminophen (TYLENOL) 325 MG tablet Take 2 tablets (650 mg total) by mouth every 6 (six) hours as needed for mild pain (or Fever >/= 101). (Patient not taking: Reported on 06/06/2019)  . aspirin 81 MG chewable tablet Chew 81 mg by mouth daily.  . bisacodyl (DULCOLAX) 10 MG suppository Place 10 mg rectally as needed for moderate constipation.  . Calcium Carbonate-Vitamin D 600-400 MG-UNIT per tablet Take 1 tablet by mouth 2 (two) times daily.  . ciprofloxacin (CIPRO) 500 MG tablet Take 1 tablet (500 mg total) by mouth 2 (two) times daily. (Patient not taking: Reported on 06/06/2019)  . diclofenac sodium (VOLTAREN) 1 % GEL Apply 2 g topically 2 (two) times daily as needed (for pain). Pt applies to hips and knees.  Marland Kitchen enoxaparin (LOVENOX) 40 MG/0.4ML injection Inject 0.4 mLs (40 mg total) into the skin daily.  . ferrous sulfate 325 (65 FE) MG tablet Take 1 tablet (325 mg total) by mouth 3 (three) times daily after meals. (Patient not taking: Reported on 06/06/2019)  . guaiFENesin-dextromethorphan (ROBITUSSIN DM) 100-10 MG/5ML syrup Take 10 mLs by mouth 3 (three) times daily as needed for cough (and/or congestion).  . haloperidol (HALDOL) 0.5 MG tablet Take 1 tablet (0.5 mg total) by mouth daily. Pt is also able to take every six hours as needed for agitation. (Patient not taking: Reported on 06/06/2019)  . HYDROcodone-acetaminophen (NORCO/VICODIN) 5-325 MG tablet Take 1-2 tablets by mouth every 6 (six) hours as needed for moderate pain. (Patient not taking: Reported on 06/06/2019)  . hydrocortisone 2.5 % cream Apply 1 application topically 2 (two) times daily as needed (for rash).   . hydrOXYzine (ATARAX/VISTARIL) 25 MG tablet Take 25 mg by mouth every 6 (six) hours as needed for itching (and/or agitation).   Marland Kitchen levothyroxine (SYNTHROID, LEVOTHROID) 75 MCG tablet Take 75 mcg by mouth daily before breakfast.   . LORazepam (ATIVAN) 0.5 MG tablet Take 1 tablet (0.5 mg total) by  mouth every 8 (eight) hours as needed (for agitation.). (Patient not taking: Reported on 06/06/2019)  . Melatonin 5 MG TABS Take 10 mg by mouth at bedtime.  . polyethylene glycol (MIRALAX / GLYCOLAX) packet Take 17 g by mouth daily as needed for moderate constipation.  . pravastatin (PRAVACHOL) 10 MG tablet Take 10 mg by mouth at bedtime.  . senna (SENOKOT) 8.6 MG tablet Take 2 tablets by mouth 2 (two) times daily.  . sertraline (ZOLOFT) 50 MG tablet Take 50 mg by mouth daily.   No facility-administered encounter medications on file as of 07/11/2019.     PHYSICAL EXAM:   General: Thin, fragile, debilitated, severely cognitively impaired female Cardiovascular: regular rate and rhythm Pulmonary: clear ant fields Extremities: no edema, no joint deformities Neurological: Weakness but otherwise nonfocal/functionally quadriplegic  Fillmore Bynum Ihor Gully, NP

## 2019-08-01 ENCOUNTER — Encounter: Payer: Self-pay | Admitting: Nurse Practitioner

## 2019-08-01 ENCOUNTER — Non-Acute Institutional Stay: Payer: Medicare Other | Admitting: Nurse Practitioner

## 2019-08-01 ENCOUNTER — Other Ambulatory Visit: Payer: Self-pay

## 2019-08-01 VITALS — BP 124/70 | HR 80 | Temp 97.8°F | Resp 20 | Wt 118.6 lb

## 2019-08-01 DIAGNOSIS — Z515 Encounter for palliative care: Secondary | ICD-10-CM

## 2019-08-01 NOTE — Progress Notes (Signed)
[place] Consult Note Telephone: [phone]  Fax: [phone]  PATIENT NAME: Cassandra Steele DOB: 08-Jun-1936 MRN: [id number]  PRIMARY CARE PROVIDER:   Dr Yves Dill PROVIDER:  Dr Hodges/[facility] RESPONSIBLE PARTY:  Sharion Balloon [phone] niece  Recommendations and Plan:  1.ACP: DNR: wishes are to focus on comfort  2.Dysphagia secondary todementia. Continue to monitor weights, appetite, aspiration precautions.  3.Anorexia  appetite remaining declined but weight gain. Continue to encourage supplements and comfort feedings.   4. Palliative care encounter; Palliative medicine team will continue to support patient, patient's family, and medical team. Visit consisted of counseling and education dealing with the complex and emotionally intense issues of symptom management and palliative care in the setting of serious and potentially life-threatening illness I spent 45 minutes providing this consultation,  from 12:00pm to 12:45pm. More than 50% of the time in this consultation was spent coordinating communication.   HISTORY OF PRESENT ILLNESS:  Cassandra Steele is a 83 y.o. year old female with multiple medical problems including includingAlzheimer's dementia, delirium, hypertension, hyperlipidemia, hypothyroidism, iron deficiency anemia, history of UTI, history ileus, anxiety, depression. Cassandra. Steele continues to reside at [place] at [facility] She remains functional quadriplegic, total ADL dependence with incontinence bowel and bladder. She does require to be fair to poor with a 3-pound weight gain. BMI 21.7. No recent wounds, Falls, infections, hospitalizations. Medical goals continue to focus on comfort with DNR and place. She previously was under [facility] but discharge due to stability. She is severely cognitively impaired and unable to verbalize her needs. 8 /  20 / 2020 care plan meeting was held and no changes at this time. Staff reports no new changes or concerns at present time. At present Cassandra Steele is lying in bed, appears debilitated, severely cognitively impaired. She appears comfortable. No visitors present. I visited and observe Cassandra Steele. She was carpeted with assessment. She did not make eye contact as she had her eyes closed there is a palliative care visit. She was nonverbal. She does continue to appear stable and chronic disease progression of dementia with weight. I have attempted to contact her niece, message left. Continue with goals of care to focus on comfort with DNR in place. No new changes to current goals or plan of care. I have updated nursing staff.  9 / 1 / 2020 covid-19 negative  Palliative Care was asked to help to continue to address goals of care.   PC 4 / 8 / 2019 PC 11 / 22 / 2016 to 11 / 22 / 2016 PC 4 / 21 / 2017 to 8 / 15 / 2017 Hospice 11 / 30 / 2016 to 4 / 10 / 2017 Hospice 8 / 8 / 15 / 2017 to 12/07/2016  BMI 19.6 9 / 4 / 2019 weight 105.2 lbs  11 / 6 / 2018 weight 108.5 lbs 1 / 2 / 2020 weight 107.2 lbs 05/22/2019 weight 115.8 lbs 9 / 3 / 2020 weight 118.6 lb  CODE STATUS: DNR  PPS: 30% HOSPICE ELIGIBILITY/DIAGNOSIS: TBD  PAST MEDICAL HISTORY:  Past Medical History:  Diagnosis Date  . Alzheimer's disease ([place])   . Anemia   . Dementia ([place])   . Depression   . Hypertension   . Mixed incontinence   . Osteoarthritis   . Thyroid disease     SOCIAL HX:  Social History   Tobacco Use  . Smoking status: Never  Smoker  Substance Use Topics  . Alcohol use: No    Alcohol/week: 0.0 standard drinks    ALLERGIES: No Known Allergies   PERTINENT MEDICATIONS:  Outpatient Encounter Medications as of 08/01/2019  Medication Sig  . acetaminophen (TYLENOL) 325 MG tablet Take 2 tablets (650 mg total) by mouth every 6 (six) hours as needed for mild pain (or Fever >/= 101). (Patient not taking: Reported on 06/06/2019)   . aspirin 81 MG chewable tablet Chew 81 mg by mouth daily.  . bisacodyl (DULCOLAX) 10 MG suppository Place 10 mg rectally as needed for moderate constipation.  . Calcium Carbonate-Vitamin D 600-400 MG-UNIT per tablet Take 1 tablet by mouth 2 (two) times daily.  . ciprofloxacin (CIPRO) 500 MG tablet Take 1 tablet (500 mg total) by mouth 2 (two) times daily. (Patient not taking: Reported on 06/06/2019)  . diclofenac sodium (VOLTAREN) 1 % GEL Apply 2 g topically 2 (two) times daily as needed (for pain). Pt applies to hips and knees.  Marland Kitchen. enoxaparin (LOVENOX) 40 MG/0.4ML injection Inject 0.4 mLs (40 mg total) into the skin daily.  . ferrous sulfate 325 (65 FE) MG tablet Take 1 tablet (325 mg total) by mouth 3 (three) times daily after meals. (Patient not taking: Reported on 06/06/2019)  . guaiFENesin-dextromethorphan (ROBITUSSIN DM) 100-10 MG/5ML syrup Take 10 mLs by mouth 3 (three) times daily as needed for cough (and/or congestion).  . haloperidol (HALDOL) 0.5 MG tablet Take 1 tablet (0.5 mg total) by mouth daily. Pt is also able to take every six hours as needed for agitation. (Patient not taking: Reported on 06/06/2019)  . HYDROcodone-acetaminophen (NORCO/VICODIN) 5-325 MG tablet Take 1-2 tablets by mouth every 6 (six) hours as needed for moderate pain. (Patient not taking: Reported on 06/06/2019)  . hydrocortisone 2.5 % cream Apply 1 application topically 2 (two) times daily as needed (for rash).   . hydrOXYzine (ATARAX/VISTARIL) 25 MG tablet Take 25 mg by mouth every 6 (six) hours as needed for itching (and/or agitation).   Marland Kitchen. levothyroxine (SYNTHROID, LEVOTHROID) 75 MCG tablet Take 75 mcg by mouth daily before breakfast.   . LORazepam (ATIVAN) 0.5 MG tablet Take 1 tablet (0.5 mg total) by mouth every 8 (eight) hours as needed (for agitation.). (Patient not taking: Reported on 06/06/2019)  . Melatonin 5 MG TABS Take 10 mg by mouth at bedtime.  . polyethylene glycol (MIRALAX / GLYCOLAX) packet Take 17 g by  mouth daily as needed for moderate constipation.  . pravastatin (PRAVACHOL) 10 MG tablet Take 10 mg by mouth at bedtime.  . senna (SENOKOT) 8.6 MG tablet Take 2 tablets by mouth 2 (two) times daily.  . sertraline (ZOLOFT) 50 MG tablet Take 50 mg by mouth daily.   No facility-administered encounter medications on file as of 08/01/2019.     PHYSICAL EXAM:   General: frail appearing, thin, severely debilitated cognitive and functionally impaired female Cardiovascular: regular rate and rhythm Pulmonary: clear ant fields Abdomen: soft, nontender, + bowel sounds Extremities: no edema, no joint deformities Neurological: functionally quadriplegic  Christin Prince RomeZ Gusler, NP

## 2019-09-10 ENCOUNTER — Encounter: Payer: Self-pay | Admitting: Nurse Practitioner

## 2019-09-10 ENCOUNTER — Non-Acute Institutional Stay: Payer: Medicare Other | Admitting: Nurse Practitioner

## 2019-09-10 VITALS — BP 117/71 | HR 89 | Temp 97.4°F | Resp 18 | Wt 102.3 lb

## 2019-09-10 DIAGNOSIS — Z515 Encounter for palliative care: Secondary | ICD-10-CM

## 2019-09-10 NOTE — Progress Notes (Signed)
[place] Consult Note Telephone: [phone]  Fax: [phone]  PATIENT NAME: Cassandra Steele DOB: 1935-12-01 MRN: [id number]   PRIMARY CARE PROVIDER:   Dr Yves Dill PROVIDER:Dr Hodges/[facility] RESPONSIBLE PARTY:Cassandra Steele [phone] niece  Recommendations and Plan:  1.ACP: DNR: wishes are to focus on comfort; Family wishes for hospice referral  06/18/2019 weight 115.1 lbs 07/17/2019 weight 118.6 lbs 09/10/2019 weight 102.4lbs  16.2lbs/4 weeks  2.Dysphagia secondary todementia. Continue to monitor weights, appetite, aspiration precautions.  3.Anorexia  appetite remaining declinedbut weight gain. Continue to encourage supplements and comfort feedings.  4. Palliative care encounter; Palliative medicine team will continue to support patient, patient's family, and medical team. Visit consisted of counseling and education dealing with the complex and emotionally intense issues of symptom management and palliative care in the setting of serious and potentially life-threatening illness  I spent 35 minutes providing this consultation,  from 10:30am to 11:05am. More than 50% of the time in this consultation was spent coordinating communication.   HISTORY OF PRESENT ILLNESS:  Cassandra Steele is a 83 y.o. year old female with multiple medical problems including Alzheimer's dementia, delirium, hypertension, hyperlipidemia, hypothyroidism, iron deficiency anemia, history of UTI, history ileus, anxiety, depression. Cassandra Steele continues to reside at [facility] [facility]. She is total ADL dependents contracted, typically in a fetal position. She is incontinent bowel and bladder. She does required to be fed and appetite has been very poor only eating a few bites a day, pocketing food. She is now covid positive and has been moved to the covid unit of the facility. She does  continue to have a cough. Staff endorses she is overall declining and feels like hospice may be appropriate. She previously was under [facility] but discharge due to stability. At present Cassandra Steele is lying in bed. She appears chronically ill, debilitated no respiratory distress. No visitors present. I visited and observed Cassandra Steele.  No verbal discussion with cognitive impairment, she is nonverbal. She was  cooperative with assessment. She appears very debilitated, muscle wasting with overall decline. Breath sounds are decreased, rhonchi. She does appear to be progressing towards end of life. I called Cassandra Steele niece, Cassandra Steele power of attorney. We talked about palliative care visit with Cassandra Steele. We talked about chronic disease progression of dementia, debilitated tate severely functional and cognitively impaired. We talked about poor appetite with little oral intake, covid positive. We talked about medical goals of care with focus on comfort. Wishes are to continue DNR. We talked about option of Hospice Services. Cassandra Steele endorses she wishes for Hospice Services for Cassandra Steele. Therapeutic listening and emotional support provided. Discussed at present time she does appear comfortable though lethargic. Questions answered to satisfaction. Contact information provided. I updated nursing staff with Cassandra Steele's wishes for Hospice Services.  Palliative Care was asked to help to continue to address goals of care.   CODE STATUS: DNR  PPS: 30% HOSPICE ELIGIBILITY/DIAGNOSIS: appears clinically presentation   PAST MEDICAL HISTORY:  Past Medical History:  Diagnosis Date  . Alzheimer's disease ([place])   . Anemia   . Dementia ([place])   . Depression   . Hypertension   . Mixed incontinence   . Osteoarthritis   . Thyroid disease     SOCIAL HX:  Social History   Tobacco Use  . Smoking status: Never Smoker  Substance Use Topics  . Alcohol use: No  Alcohol/week: 0.0 standard drinks     ALLERGIES: No Known Allergies   PERTINENT MEDICATIONS:  Outpatient Encounter Medications as of 09/10/2019  Medication Sig  . acetaminophen (TYLENOL) 325 MG tablet Take 2 tablets (650 mg total) by mouth every 6 (six) hours as needed for mild pain (or Fever >/= 101). (Patient not taking: Reported on 06/06/2019)  . aspirin 81 MG chewable tablet Chew 81 mg by mouth daily.  . bisacodyl (DULCOLAX) 10 MG suppository Place 10 mg rectally as needed for moderate constipation.  . Calcium Carbonate-Vitamin D 600-400 MG-UNIT per tablet Take 1 tablet by mouth 2 (two) times daily.  . ciprofloxacin (CIPRO) 500 MG tablet Take 1 tablet (500 mg total) by mouth 2 (two) times daily. (Patient not taking: Reported on 06/06/2019)  . diclofenac sodium (VOLTAREN) 1 % GEL Apply 2 g topically 2 (two) times daily as needed (for pain). Pt applies to hips and knees.  Marland Kitchen enoxaparin (LOVENOX) 40 MG/0.4ML injection Inject 0.4 mLs (40 mg total) into the skin daily.  . ferrous sulfate 325 (65 FE) MG tablet Take 1 tablet (325 mg total) by mouth 3 (three) times daily after meals. (Patient not taking: Reported on 06/06/2019)  . guaiFENesin-dextromethorphan (ROBITUSSIN DM) 100-10 MG/5ML syrup Take 10 mLs by mouth 3 (three) times daily as needed for cough (and/or congestion).  . haloperidol (HALDOL) 0.5 MG tablet Take 1 tablet (0.5 mg total) by mouth daily. Pt is also able to take every six hours as needed for agitation. (Patient not taking: Reported on 06/06/2019)  . HYDROcodone-acetaminophen (NORCO/VICODIN) 5-325 MG tablet Take 1-2 tablets by mouth every 6 (six) hours as needed for moderate pain. (Patient not taking: Reported on 06/06/2019)  . hydrocortisone 2.5 % cream Apply 1 application topically 2 (two) times daily as needed (for rash).   . hydrOXYzine (ATARAX/VISTARIL) 25 MG tablet Take 25 mg by mouth every 6 (six) hours as needed for itching (and/or agitation).   Marland Kitchen levothyroxine (SYNTHROID, LEVOTHROID) 75 MCG tablet Take 75 mcg by mouth  daily before breakfast.   . LORazepam (ATIVAN) 0.5 MG tablet Take 1 tablet (0.5 mg total) by mouth every 8 (eight) hours as needed (for agitation.). (Patient not taking: Reported on 06/06/2019)  . Melatonin 5 MG TABS Take 10 mg by mouth at bedtime.  . polyethylene glycol (MIRALAX / GLYCOLAX) packet Take 17 g by mouth daily as needed for moderate constipation.  . pravastatin (PRAVACHOL) 10 MG tablet Take 10 mg by mouth at bedtime.  . senna (SENOKOT) 8.6 MG tablet Take 2 tablets by mouth 2 (two) times daily.  . sertraline (ZOLOFT) 50 MG tablet Take 50 mg by mouth daily.   No facility-administered encounter medications on file as of 09/10/2019.     PHYSICAL EXAM:   General:  frail appearing, thin, severely debilitated, cognitively impaired, chronically ill, female Cardiovascular: regular rate and rhythm Pulmonary: poor air movement, rhonchi Abdomen: soft, nontender, + bowel sounds Extremities: +Muscle wasting; atrophy, contracted Neurological: functional quadriplegic  Gwendalynn Eckstrom [place], NP

## 2019-09-11 ENCOUNTER — Other Ambulatory Visit: Payer: Self-pay

## 2019-09-14 DEATH — deceased
